# Patient Record
Sex: Male | Born: 1970 | Race: Black or African American | Hispanic: No | Marital: Single | Smoking: Former smoker
Health system: Southern US, Community
[De-identification: ages and names within clinical notes are randomized; demographics above are authoritative.]

## PROBLEM LIST (undated history)

## (undated) DIAGNOSIS — F329 Major depressive disorder, single episode, unspecified: Secondary | ICD-10-CM

## (undated) DIAGNOSIS — F32A Depression, unspecified: Secondary | ICD-10-CM

## (undated) DIAGNOSIS — Z89619 Acquired absence of unspecified leg above knee: Secondary | ICD-10-CM

## (undated) DIAGNOSIS — K219 Gastro-esophageal reflux disease without esophagitis: Secondary | ICD-10-CM

## (undated) DIAGNOSIS — M1990 Unspecified osteoarthritis, unspecified site: Secondary | ICD-10-CM

## (undated) HISTORY — DX: Gastro-esophageal reflux disease without esophagitis: K21.9

## (undated) HISTORY — PX: SKIN GRAFT: SHX250

## (undated) HISTORY — DX: Unspecified osteoarthritis, unspecified site: M19.90

## (undated) HISTORY — PX: ABDOMINAL SURGERY: SHX537

## (undated) HISTORY — PX: FOOT FUSION: SHX956

## (undated) HISTORY — PX: ABOVE KNEE LEG AMPUTATION: SUR20

## (undated) HISTORY — DX: Major depressive disorder, single episode, unspecified: F32.9

## (undated) HISTORY — DX: Depression, unspecified: F32.A

## (undated) HISTORY — DX: Acquired absence of unspecified leg above knee: Z89.619

## (undated) HISTORY — PX: CHOLECYSTECTOMY: SHX55

---

## 1999-12-14 DIAGNOSIS — Z89619 Acquired absence of unspecified leg above knee: Secondary | ICD-10-CM

## 1999-12-14 HISTORY — DX: Acquired absence of unspecified leg above knee: Z89.619

## 2013-04-07 ENCOUNTER — Emergency Department (HOSPITAL_COMMUNITY): Payer: Medicaid Other

## 2013-04-07 ENCOUNTER — Encounter (HOSPITAL_COMMUNITY): Payer: Self-pay | Admitting: Emergency Medicine

## 2013-04-07 ENCOUNTER — Telehealth (HOSPITAL_COMMUNITY): Payer: Self-pay | Admitting: Emergency Medicine

## 2013-04-07 ENCOUNTER — Emergency Department (HOSPITAL_COMMUNITY)
Admission: EM | Admit: 2013-04-07 | Discharge: 2013-04-07 | Disposition: A | Discharge status: Home / Self Care | Payer: Medicaid Other | Attending: Emergency Medicine | Admitting: Emergency Medicine

## 2013-04-07 DIAGNOSIS — W050XXA Fall from non-moving wheelchair, initial encounter: Secondary | ICD-10-CM | POA: Insufficient documentation

## 2013-04-07 DIAGNOSIS — Y9389 Activity, other specified: Secondary | ICD-10-CM | POA: Insufficient documentation

## 2013-04-07 DIAGNOSIS — S78119A Complete traumatic amputation at level between unspecified hip and knee, initial encounter: Secondary | ICD-10-CM | POA: Insufficient documentation

## 2013-04-07 DIAGNOSIS — S322XXA Fracture of coccyx, initial encounter for closed fracture: Secondary | ICD-10-CM

## 2013-04-07 DIAGNOSIS — S3210XA Unspecified fracture of sacrum, initial encounter for closed fracture: Secondary | ICD-10-CM | POA: Insufficient documentation

## 2013-04-07 DIAGNOSIS — Y92009 Unspecified place in unspecified non-institutional (private) residence as the place of occurrence of the external cause: Secondary | ICD-10-CM | POA: Insufficient documentation

## 2013-04-07 MED ORDER — MELOXICAM 15 MG PO TABS: 15.0000 mg | ORAL_TABLET | Freq: Every day | ORAL | Status: DC

## 2013-04-07 MED ORDER — HYDROCODONE-ACETAMINOPHEN 5-325 MG PO TABS: 1.0000 | ORAL_TABLET | Freq: Four times a day (QID) | ORAL | Status: DC | PRN

## 2013-04-07 NOTE — ED Notes (Signed)
NAD noted at time of d/c home 

## 2013-04-07 NOTE — ED Notes (Signed)
Pt with c/o tailbone/back pain that started "several weeks ago" after a fall. Pt was sliding into his wheelchair when chair moved and pt fell to floor. Denies any open areas or pressure sores.

## 2013-04-07 NOTE — ED Provider Notes (Signed)
Medical screening examination/treatment/procedure(s) were performed by non-physician practitioner and as supervising physician I was immediately available for consultation/collaboration.   Xoey Warmoth B. Bernette Mayers, MD 04/07/13 [phone]

## 2013-04-07 NOTE — ED Provider Notes (Signed)
History     CSN: [id number]  Arrival date & time 04/07/13  0711   First MD Initiated Contact with Patient 04/07/13 [phone]      Chief Complaint  Patient presents with  . Tailbone Pain    (Consider location/radiation/quality/duration/timing/severity/associated sxs/prior treatment) HPI [name] is a 42 year old male who presents emergency Department with chief complaint of tailbone pain. The patient has a history of traumatic leg amputation.  He uses a wheelchair. The patient states that one month ago as he was transferring from his bed into the wheelchair he slipped and fell directly on his bottom. Patient states that he has had chronic aching pain since that time.  He has been using ibuprofen without relief.  Patient states that over the past few days it has been waking him up when he rolls over onto his bottom.  She denies any sores or rashes on his bottom.  He denies any radiation into his hips or legs.  Denies any weakness.  No numbness or paresthesias. Denies fevers, chills, myalgias, arthralgias. Denies DOE, SOB, chest tightness or pressure, radiation to left arm, jaw or back, or diaphoresis. Denies dysuria, flank pain, suprapubic pain, frequency, urgency, or hematuria. Denies headaches, light headedness, weakness, visual disturbances. Denies abdominal pain, nausea, vomiting, diarrhea or constipation.    History reviewed. No pertinent past medical history.  Past Surgical History  Procedure Laterality Date  . Skin graft    . Abdominal surgery    . Above knee leg amputation Left   . Foot fusion Right     No family history on file.  History  Substance Use Topics  . Smoking status: Never Smoker   . Smokeless tobacco: Not on file  . Alcohol Use: No      Review of Systems Ten systems reviewed and are negative for acute change, except as noted in the HPI.   Allergies  Morphine and related  Home Medications   Current Outpatient Rx  Name  Route  Sig  Dispense  Refill   . acetaminophen (TYLENOL) 325 MG tablet   Oral   Take 650 mg by mouth every 6 (six) hours as needed for pain.         Marland Kitchen ibuprofen (ADVIL,MOTRIN) 800 MG tablet   Oral   Take 800 mg by mouth every 8 (eight) hours as needed for pain.           BP 122/80  Pulse 82  Temp(Src) 97.8 F (36.6 C) (Oral)  SpO2 98%  Physical Exam  Nursing note and vitals reviewed. Constitutional: He is oriented to person, place, and time. He appears well-developed and well-nourished. No distress.  HENT:  Head: Normocephalic and atraumatic.  Eyes: Conjunctivae are normal. No scleral icterus.  Neck: Normal range of motion. Neck supple.  Cardiovascular: Normal rate, regular rhythm and normal heart sounds.   Pulmonary/Chest: Effort normal and breath sounds normal. No respiratory distress.  Abdominal: Soft. There is no tenderness.  Musculoskeletal: He exhibits no edema.  Patient with exquisite tenderness to palpation of the coccyx.  No sores or rashes noted.  No open wounds.  Neurological: He is alert and oriented to person, place, and time. He has normal reflexes.  Skin: Skin is warm and dry. He is not diaphoretic.  Psychiatric: His behavior is normal.    ED Course  Procedures (including critical care time)  Labs Reviewed - No data to display Dg Sacrum/coccyx  04/07/2013  *RADIOLOGY REPORT*  Clinical Data: Post fall with persistent tail bone pain  SACRUM AND COCCYX - 2+ VIEW  Comparison: None.  Findings:  There is an ill-defined vertically aligned lucency through the mid aspect of the coccyx, nonspecific though may represent a nondisplaced fracture.  Limited visualization of the bilateral SI joints and pubic symphysis is normal.  Regional soft tissues are normal.  No radiopaque foreign body.  IMPRESSION:  Ill-defined vertically aligned lucency through the mid aspect of the coccyx, possibly a nutrient foramen, though a nondisplaced fracture may have a similar appearance.  Correlation for point tenderness  at this location is recommended.   Original Report Authenticated By: Tacey Ruiz, MD      1. Fracture of coccyx, closed, initial encounter       MDM  9:15 AM Patient with history of trauma to his tailbone.  X-ray is suspicious for a fracture of the coccyx.  He did correlation of point tenderness at the coccyx I suspect a healing fracture.  Patient will be discharged with NSAIDs and narcotic pain medications.  I given him followup with orthopedics.  The patient appears reasonably screened and/or stabilized for discharge and I doubt any other medical condition or other [facility] requiring further screening, evaluation, or treatment in the ED at this time prior to discharge.         Arthor Captain, PA-C 04/07/13 [phone]

## 2013-06-09 ENCOUNTER — Emergency Department (HOSPITAL_COMMUNITY)
Admission: EM | Admit: 2013-06-09 | Discharge: 2013-06-09 | Disposition: A | Discharge status: Home / Self Care | Payer: Medicaid Other | Attending: Emergency Medicine | Admitting: Emergency Medicine

## 2013-06-09 ENCOUNTER — Encounter (HOSPITAL_COMMUNITY): Payer: Self-pay | Admitting: Emergency Medicine

## 2013-06-09 DIAGNOSIS — M533 Sacrococcygeal disorders, not elsewhere classified: Secondary | ICD-10-CM | POA: Insufficient documentation

## 2013-06-09 DIAGNOSIS — S78119A Complete traumatic amputation at level between unspecified hip and knee, initial encounter: Secondary | ICD-10-CM | POA: Insufficient documentation

## 2013-06-09 DIAGNOSIS — N400 Enlarged prostate without lower urinary tract symptoms: Secondary | ICD-10-CM | POA: Insufficient documentation

## 2013-06-09 DIAGNOSIS — S3992XS Unspecified injury of lower back, sequela: Secondary | ICD-10-CM

## 2013-06-09 DIAGNOSIS — G8918 Other acute postprocedural pain: Secondary | ICD-10-CM | POA: Insufficient documentation

## 2013-06-09 MED ORDER — HYDROCODONE-ACETAMINOPHEN 5-325 MG PO TABS: ORAL_TABLET | ORAL | Status: DC

## 2013-06-09 NOTE — ED Provider Notes (Signed)
History    CSN: [id number] Arrival date & time 06/09/13  1149  First MD Initiated Contact with Patient 06/09/13 1240     Chief Complaint  Patient presents with  . Tailbone Pain   (Consider location/radiation/quality/duration/timing/severity/associated sxs/prior Treatment) HPI Comments: Patient presents with complaint of tailbone pain for the past 5 months. Patient had onset of pain after a fall onto his tailbone. He was seen in emergency department 2 months ago and was diagnosed with coccygeal fracture. Patient states that he's been treating himself with ibuprofen which has not been helping. Pain is waxing and waning but has become more severe in the past 5 days and is worse than it has been. No new injury the patient does a lot of sitting because of left below-the-knee amputation. Patient does have some pain with defecation. He has not noted any blood in the stool. He does state that he has had some recent trouble with erectile dysfunction and is wondering if this is part of his problem. Onset of symptoms gradual. Course is constant.   The history is provided by the patient.   History reviewed. No pertinent past medical history. Past Surgical History  Procedure Laterality Date  . Skin graft    . Abdominal surgery    . Above knee leg amputation Left   . Foot fusion Right    No family history on file. History  Substance Use Topics  . Smoking status: Never Smoker   . Smokeless tobacco: Not on file  . Alcohol Use: No    Review of Systems  Constitutional: Negative for fever.  HENT: Negative for sore throat and rhinorrhea.   Eyes: Negative for redness.  Respiratory: Negative for cough.   Cardiovascular: Negative for chest pain.  Gastrointestinal: Negative for nausea, vomiting, abdominal pain, diarrhea, blood in stool and rectal pain.  Genitourinary: Negative for dysuria.  Musculoskeletal: Positive for arthralgias. Negative for myalgias.  Skin: Negative for rash.  Neurological:  Negative for headaches.    Allergies  Morphine and related  Home Medications   Current Outpatient Rx  Name  Route  Sig  Dispense  Refill  . Ibuprofen (IBU PO)   Oral   Take 1-2 tablets by mouth every 4 (four) hours as needed (pain).          BP 130/74  Pulse 68  Temp(Src) 98.4 F (36.9 C)  Resp 16  SpO2 99% Physical Exam  Nursing note and vitals reviewed. Constitutional: He appears well-developed and well-nourished.  HENT:  Head: Normocephalic and atraumatic.  Eyes: Conjunctivae are normal.  Neck: Normal range of motion. Neck supple.  Pulmonary/Chest: No respiratory distress.  Genitourinary: Rectal exam shows tenderness (Tenderness with direct pressure over lower sacrum). Rectal exam shows no external hemorrhoid, no internal hemorrhoid, no fissure, no mass and anal tone normal. Guaiac negative stool. Prostate is enlarged. Prostate is not tender.  Musculoskeletal:  Tenderness over lower sacrum and coccyx externally.  Neurological: He is alert.  Skin: Skin is warm and dry.  Psychiatric: He has a normal mood and affect.    ED Course  Procedures (including critical care time) Labs Reviewed  OCCULT BLOOD X 1 CARD TO LAB, STOOL   No results found. 1. Tailbone injury, sequela     1:16 PM Patient seen and examined.   Vital signs reviewed and are as follows: Filed Vitals:   06/09/13 1152  BP: 130/74  Pulse: 68  Temp: 98.4 F (36.9 C)  Resp: 16   Rectal exam performed with nurse  chaperone.   Hemoccult negative. Patient informed.  Will discharge home with pain medication. Patient to continue ibuprofen as directed.  PCP referrals given. Patient urged to followup for evaluation of erectile dysfunction.   Orthopedic referral given in case pain persists.  Patient counseled on use of narcotic pain medications. Counseled not to combine these medications with others containing tylenol. Urged not to drink alcohol, drive, or perform any other activities that requires  focus while taking these medications. The patient verbalizes understanding and agrees with the plan.   MDM  Tailbone pain: Persistent. Pain is localized to sacrum and coccyx in external and internal exams. No masses felt. Orthopedic followup given with pain medication.  Erectile dysfunction: Referred to primary care physician for  evaluation.  Renne Crigler, PA-C 06/09/13 1520

## 2013-06-09 NOTE — ED Provider Notes (Signed)
Medical screening examination/treatment/procedure(s) were performed by non-physician practitioner and as supervising physician I was immediately available for consultation/collaboration. Devoria Albe, MD, Armando Gang   Ward Givens, MD 06/09/13 [phone]

## 2013-06-09 NOTE — ED Notes (Signed)
Pt. Stated, I've had tailbone pain for 5 months.

## 2013-07-31 ENCOUNTER — Encounter: Payer: Self-pay | Admitting: Internal Medicine

## 2013-07-31 ENCOUNTER — Ambulatory Visit: Payer: Medicaid Other | Attending: Internal Medicine | Admitting: Internal Medicine

## 2013-07-31 VITALS — BP 117/82 | HR 77 | Temp 97.8°F | Resp 16

## 2013-07-31 DIAGNOSIS — F32A Depression, unspecified: Secondary | ICD-10-CM | POA: Insufficient documentation

## 2013-07-31 DIAGNOSIS — N529 Male erectile dysfunction, unspecified: Secondary | ICD-10-CM

## 2013-07-31 DIAGNOSIS — G589 Mononeuropathy, unspecified: Secondary | ICD-10-CM

## 2013-07-31 DIAGNOSIS — G629 Polyneuropathy, unspecified: Secondary | ICD-10-CM | POA: Insufficient documentation

## 2013-07-31 DIAGNOSIS — K219 Gastro-esophageal reflux disease without esophagitis: Secondary | ICD-10-CM

## 2013-07-31 DIAGNOSIS — F3289 Other specified depressive episodes: Secondary | ICD-10-CM | POA: Insufficient documentation

## 2013-07-31 DIAGNOSIS — F329 Major depressive disorder, single episode, unspecified: Secondary | ICD-10-CM | POA: Insufficient documentation

## 2013-07-31 MED ORDER — SERTRALINE HCL 50 MG PO TABS: 50.0000 mg | ORAL_TABLET | Freq: Every day | ORAL | Status: DC

## 2013-07-31 MED ORDER — OMEPRAZOLE 40 MG PO CPDR: 40.0000 mg | DELAYED_RELEASE_CAPSULE | Freq: Every day | ORAL | Status: DC

## 2013-07-31 MED ORDER — GABAPENTIN 300 MG PO CAPS: 300.0000 mg | ORAL_CAPSULE | Freq: Three times a day (TID) | ORAL | Status: DC

## 2013-07-31 MED ORDER — TRAMADOL HCL 50 MG PO TABS: 50.0000 mg | ORAL_TABLET | Freq: Four times a day (QID) | ORAL | Status: DC | PRN
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
## 2013-07-31 NOTE — Progress Notes (Signed)
A1C 5.4%

## 2013-08-01 ENCOUNTER — Encounter: Payer: Self-pay | Admitting: Internal Medicine

## 2013-08-01 LAB — TSH: TSH: 0.759 u[IU]/mL (ref 0.350–4.500)

## 2013-08-01 LAB — HEMOGLOBIN A1C: Mean Plasma Glucose: 123 mg/dL — ABNORMAL HIGH (ref <117)

## 2013-08-01 LAB — CBC WITH DIFFERENTIAL/PLATELET
Basophils Absolute: 0 10*3/uL (ref 0.0–0.1)
Basophils Relative: 0 % (ref 0–1)
Eosinophils Relative: 1 % (ref 0–5)
Lymphocytes Relative: 28 % (ref 12–46)
MCHC: 33.9 g/dL (ref 30.0–36.0)
MCV: 81.5 fL (ref 78.0–100.0)
Monocytes Absolute: 0.5 10*3/uL (ref 0.1–1.0)
Neutro Abs: 5.4 10*3/uL (ref 1.7–7.7)
Platelets: 233 10*3/uL (ref 150–400)
RDW: 13.7 % (ref 11.5–15.5)
WBC: 8.3 10*3/uL (ref 4.0–10.5)

## 2013-08-01 LAB — COMPLETE METABOLIC PANEL WITH GFR
Albumin: 4.4 g/dL (ref 3.5–5.2)
BUN: 9 mg/dL (ref 6–23)
Calcium: 10.4 mg/dL (ref 8.4–10.5)
Chloride: 101 mEq/L (ref 96–112)
GFR, Est Non African American: >89 mL/min
Glucose, Bld: 77 mg/dL (ref 70–99)
Potassium: 4 mEq/L (ref 3.5–5.3)
Sodium: 138 mEq/L (ref 135–145)
Total Protein: 7.8 g/dL (ref 6.0–8.3)

## 2013-08-01 LAB — LIPID PANEL
HDL: 45 mg/dL (ref >39)
LDL Cholesterol: 92 mg/dL (ref 0–99)
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
## 2013-08-04 ENCOUNTER — Emergency Department (HOSPITAL_COMMUNITY): Payer: Medicaid Other

## 2013-08-04 ENCOUNTER — Telehealth (HOSPITAL_COMMUNITY): Payer: Self-pay | Admitting: *Deleted

## 2013-08-04 ENCOUNTER — Emergency Department (HOSPITAL_COMMUNITY)
Admission: EM | Admit: 2013-08-04 | Discharge: 2013-08-04 | Disposition: A | Discharge status: Home / Self Care | Payer: Medicaid Other | Attending: Emergency Medicine | Admitting: Emergency Medicine

## 2013-08-04 DIAGNOSIS — J3489 Other specified disorders of nose and nasal sinuses: Secondary | ICD-10-CM | POA: Insufficient documentation

## 2013-08-04 DIAGNOSIS — F3289 Other specified depressive episodes: Secondary | ICD-10-CM | POA: Insufficient documentation

## 2013-08-04 DIAGNOSIS — J029 Acute pharyngitis, unspecified: Secondary | ICD-10-CM | POA: Insufficient documentation

## 2013-08-04 DIAGNOSIS — F329 Major depressive disorder, single episode, unspecified: Secondary | ICD-10-CM | POA: Insufficient documentation

## 2013-08-04 DIAGNOSIS — Z79899 Other long term (current) drug therapy: Secondary | ICD-10-CM | POA: Insufficient documentation

## 2013-08-04 DIAGNOSIS — F17200 Nicotine dependence, unspecified, uncomplicated: Secondary | ICD-10-CM | POA: Insufficient documentation

## 2013-08-04 DIAGNOSIS — J069 Acute upper respiratory infection, unspecified: Secondary | ICD-10-CM

## 2013-08-04 DIAGNOSIS — G629 Polyneuropathy, unspecified: Secondary | ICD-10-CM

## 2013-08-04 DIAGNOSIS — S78119A Complete traumatic amputation at level between unspecified hip and knee, initial encounter: Secondary | ICD-10-CM | POA: Insufficient documentation

## 2013-08-04 MED ORDER — TRAMADOL HCL 50 MG PO TABS: 50.0000 mg | ORAL_TABLET | Freq: Four times a day (QID) | ORAL | Status: DC | PRN

## 2013-08-04 MED ORDER — HYDROCODONE-HOMATROPINE 5-1.5 MG/5ML PO SYRP: 5.0000 mL | ORAL_SOLUTION | Freq: Four times a day (QID) | ORAL | Status: DC | PRN

## 2013-08-04 MED ORDER — KETOROLAC TROMETHAMINE 60 MG/2ML IM SOLN
60.0000 mg | Freq: Once | INTRAMUSCULAR | Status: AC
  Administered 2013-08-04: 60 mg via INTRAMUSCULAR
  Filled 2013-08-04: qty 2

## 2013-08-04 MED ORDER — OXYMETAZOLINE HCL 0.05 % NA SOLN: 2.0000 | Freq: Two times a day (BID) | NASAL | Status: DC

## 2013-08-04 NOTE — ED Notes (Signed)
Pt c/o productive cough with yellow green sputum. Pt also c/o fever and sore throat for the past 2 days. Additionally, pt states "I broke my tail bone." Pt is complaining of coccyx pain and lower back pain. Pt is wheelchair bound.

## 2013-08-04 NOTE — ED Provider Notes (Signed)
CSN: [id number]     Arrival date & time 08/04/13  2121 History  This chart was scribed for non-physician practitioner working with Junius Argyle, MD by Ronal Fear, ED scribe. This patient was seen in room WTR8/WTR8 and the patient's care was started at 10:10 PM.    Chief Complaint  Patient presents with  . Cough    The history is provided by the patient. No language interpreter was used.   HPI Comments: Isaac Thomas is a 42 y.o. male who presents to the Emergency Department complaining of gradual onset, intermittent prodcutive cough of yellow sputum with associated congestion and sore throat that started 2 days ago. Complains of fever he had yesterday but did not check his temperature.He has tried nasal spray and sinus medication with no relief. No aggravating factors. Pt has chronic lower back pain due to a broken tail bone and is requesting pain medication. No new injury or changes in chronic back pain symptoms. Pt denies chills, rhinorrhea, ear pain and CP as associated symptoms.    Past Medical History  Diagnosis Date  . 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 Depression    Past Surgical History  Procedure Laterality Date  . Skin graft    . Abdominal surgery    . Above knee leg amputation Left   . Foot fusion Right    Family History  Problem Relation Age of Onset  . Hypertension Mother    History  Substance Use Topics  . Smoking status: Current Some Day Smoker    Types: Cigarettes  . Smokeless tobacco: Not on file     Comment: 1- 2 per day  . Alcohol Use: No    Review of Systems  HENT: Positive for congestion and sore throat. Negative for ear pain and rhinorrhea.   Respiratory: Positive for cough.   Cardiovascular: Negative for chest pain.  All other systems reviewed and are negative.    Allergies  Morphine and related  Home Medications   Current Outpatient Rx  Name  Route  Sig  Dispense  Refill  . gabapentin (NEURONTIN) 300 MG capsule   Oral  Take 1 capsule (300 mg total) by mouth 3 (three) times daily.   90 capsule   3   . HYDROcodone-acetaminophen (NORCO/VICODIN) 5-325 MG per tablet      Take 1-2 tablets every 6 hours as needed for severe pain   12 tablet   0   . HYDROcodone-homatropine (HYCODAN) 5-1.5 MG/5ML syrup   Oral   Take 5 mLs by mouth every 6 (six) hours as needed for cough.   120 mL   0   . Ibuprofen (IBU PO)   Oral   Take 1-2 tablets by mouth every 4 (four) hours as needed (pain).         Marland Kitchen omeprazole (PRILOSEC) 40 MG capsule   Oral   Take 1 capsule (40 mg total) by mouth daily.   30 capsule   3   . oxymetazoline (AFRIN NASAL SPRAY) 0.05 % nasal spray   Nasal   Place 2 sprays into the nose 2 (two) times daily.   30 mL   0   . sertraline (ZOLOFT) 50 MG tablet   Oral   Take 1 tablet (50 mg total) by mouth daily.   30 tablet   3   . traMADol (ULTRAM) 50 MG tablet   Oral   Take 1 tablet (50 mg total) by mouth every 6 (six) hours as  needed for pain.   10 tablet   0    BP 107/85  Pulse 85  Temp(Src) 98.1 F (36.7 C) (Oral)  Resp 17  SpO2 98% Physical Exam  Nursing note and vitals reviewed. Constitutional: He is oriented to person, place, and time. He appears well-developed and well-nourished. No distress.  HENT:  Head: Normocephalic and atraumatic.  Right Ear: External ear normal.  Left Ear: External ear normal.  Nose: Rhinorrhea present. Right sinus exhibits no frontal sinus tenderness. Left sinus exhibits no frontal sinus tenderness.  Mouth/Throat: Uvula is midline, oropharynx is clear and moist and mucous membranes are normal. No oropharyngeal exudate.  Eyes: Conjunctivae and EOM are normal.  Neck: Neck supple. No tracheal deviation present.  Cardiovascular: Normal rate, regular rhythm and normal heart sounds.   Pulmonary/Chest: Effort normal and breath sounds normal. No respiratory distress. He exhibits no tenderness.  Abdominal: Soft. There is no tenderness.  Musculoskeletal:  Normal range of motion.  Lymphadenopathy:    He has no cervical adenopathy.  Neurological: He is alert and oriented to person, place, and time.  Skin: Skin is warm and dry.  Psychiatric: He has a normal mood and affect. His behavior is normal.    ED Course  DIAGNOSTIC STUDIES: Oxygen Saturation is 98% on room air, normal by my interpretation.    COORDINATION OF CARE: 10:11 PM-Discussed treatment plan which includes tylenol with codeine with pt at bedside and pt agreed to plan.    Procedures (including critical care time)  Labs Reviewed - No data to display Dg Chest 2 View  08/04/2013   *RADIOLOGY REPORT*  Clinical Data: Cough for 3 days.  Smoker.  Shortness of breath.  CHEST - 2 VIEW  Comparison: None.  Findings: The heart size and pulmonary vascularity are normal. The lungs appear clear and expanded without focal air space disease or consolidation. No blunting of the costophrenic angles.  No pneumothorax.  Mediastinal contours appear intact.  Mild degenerative changes in the spine.  IMPRESSION: No evidence of active pulmonary disease.   Original Report Authenticated By: Burman Nieves, M.D.   1. URI (upper respiratory infection)   2. Neuropathy     MDM  Afebrile, NAD, non-toxic appearing, AAOx4. Pt CXR negative for acute infiltrate. Patients symptoms are consistent with URI, likely viral etiology. Discussed that antibiotics are not indicated for viral infections. Pt will be discharged with symptomatic treatment.  Verbalizes understanding and is agreeable with plan. Pt is hemodynamically stable & in NAD prior to dc. Patient with back pain.  No neurological deficits and normal neuro exam.  No loss of bowel or bladder control.  No concern for cauda equina.  No fever, night sweats, weight loss, h/o cancer, IVDU.  RICE protocol and pain medicine indicated and discussed with patient.  I have reviewed records of multiple ED visits with similar or other pain related complaints, usually with  negative workups.  I feel that the pt's pain is chronic. Discussed chronic pain management and that the ED is not designed to treat chronic pain. Provided resource list that includes the number for Pain Management as well as information on ED chronic pain management policy.Treated pain in ED and given limited prescription of pain medication for at home. Discussed reasons to seek immediate care. Patient expresses understanding and agrees with plan.           I personally performed the services described in this documentation, which was scribed in my presence. The recorded information has been reviewed and is  accurate.     Jeannetta Ellis, PA-C 08/05/13 [phone]

## 2013-08-05 NOTE — ED Provider Notes (Signed)
Medical screening examination/treatment/procedure(s) were performed by non-physician practitioner and as supervising physician I was immediately available for consultation/collaboration.   Junius Argyle, MD 08/05/13 1144

## 2013-08-06 ENCOUNTER — Telehealth: Payer: Self-pay | Admitting: Emergency Medicine

## 2013-08-06 NOTE — Telephone Encounter (Signed)
Pt need referral for new w/c. States w/c is broke

## 2013-08-08 ENCOUNTER — Telehealth: Payer: Self-pay

## 2013-08-08 NOTE — Telephone Encounter (Signed)
Patient is aware of his lab results Will watch his diet

## 2013-08-08 NOTE — Telephone Encounter (Signed)
Message copied by Lestine Mount on Wed Aug 08, 2013  1:57 PM ------      Message from: Jeanann Lewandowsky E      Created: Wed Aug 08, 2013 12:00 PM       Please call patient to inform him her most of her lab results come back normal, however his hemoglobin A1c indicates that he is at risk of developing diabetes, and his triglyceride is slightly elevated. At this point he needs to adhere with diet and exercise that would reduce his risk of developing diabetes. This will include a carbohydrate diet, low-fat diet, high protein diet, lots of vegetables. And exercise at least 3 times a week,30 minutes each time. We will need to repeat all this lab again in about 3 months. ------

## 2013-08-12 ENCOUNTER — Encounter (HOSPITAL_COMMUNITY): Payer: Self-pay | Admitting: *Deleted

## 2013-08-12 ENCOUNTER — Inpatient Hospital Stay (HOSPITAL_COMMUNITY)
Admission: EM | Admit: 2013-08-12 | Discharge: 2013-08-16 | DRG: 603 | Disposition: A | Discharge status: Home / Self Care | Payer: Medicaid Other | Attending: Internal Medicine | Admitting: Internal Medicine

## 2013-08-12 DIAGNOSIS — Z993 Dependence on wheelchair: Secondary | ICD-10-CM

## 2013-08-12 DIAGNOSIS — D72829 Elevated white blood cell count, unspecified: Secondary | ICD-10-CM | POA: Diagnosis present

## 2013-08-12 DIAGNOSIS — Z79899 Other long term (current) drug therapy: Secondary | ICD-10-CM

## 2013-08-12 DIAGNOSIS — L0390 Cellulitis, unspecified: Secondary | ICD-10-CM

## 2013-08-12 DIAGNOSIS — L0291 Cutaneous abscess, unspecified: Secondary | ICD-10-CM

## 2013-08-12 DIAGNOSIS — Z89619 Acquired absence of unspecified leg above knee: Secondary | ICD-10-CM

## 2013-08-12 DIAGNOSIS — F329 Major depressive disorder, single episode, unspecified: Secondary | ICD-10-CM

## 2013-08-12 DIAGNOSIS — S78119A Complete traumatic amputation at level between unspecified hip and knee, initial encounter: Secondary | ICD-10-CM

## 2013-08-12 DIAGNOSIS — L02419 Cutaneous abscess of limb, unspecified: Principal | ICD-10-CM | POA: Diagnosis present

## 2013-08-12 DIAGNOSIS — Z945 Skin transplant status: Secondary | ICD-10-CM

## 2013-08-12 DIAGNOSIS — K219 Gastro-esophageal reflux disease without esophagitis: Secondary | ICD-10-CM | POA: Diagnosis present

## 2013-08-12 DIAGNOSIS — M533 Sacrococcygeal disorders, not elsewhere classified: Secondary | ICD-10-CM | POA: Diagnosis present

## 2013-08-12 DIAGNOSIS — G629 Polyneuropathy, unspecified: Secondary | ICD-10-CM | POA: Diagnosis present

## 2013-08-12 DIAGNOSIS — F17200 Nicotine dependence, unspecified, uncomplicated: Secondary | ICD-10-CM | POA: Diagnosis present

## 2013-08-12 DIAGNOSIS — G8929 Other chronic pain: Secondary | ICD-10-CM | POA: Diagnosis present

## 2013-08-12 DIAGNOSIS — G589 Mononeuropathy, unspecified: Secondary | ICD-10-CM | POA: Diagnosis present

## 2013-08-12 DIAGNOSIS — E876 Hypokalemia: Secondary | ICD-10-CM | POA: Diagnosis not present

## 2013-08-12 DIAGNOSIS — N529 Male erectile dysfunction, unspecified: Secondary | ICD-10-CM

## 2013-08-12 LAB — CBC WITH DIFFERENTIAL/PLATELET
Basophils Absolute: 0 10*3/uL (ref 0.0–0.1)
Lymphocytes Relative: 6 % — ABNORMAL LOW (ref 12–46)
Lymphs Abs: 1.1 10*3/uL (ref 0.7–4.0)
MCV: 83.5 fL (ref 78.0–100.0)
Neutro Abs: 17.7 10*3/uL — ABNORMAL HIGH (ref 1.7–7.7)
Neutrophils Relative %: 91 % — ABNORMAL HIGH (ref 43–77)
Platelets: 227 10*3/uL (ref 150–400)
RBC: 5.81 MIL/uL (ref 4.22–5.81)
RDW: 13.2 % (ref 11.5–15.5)
WBC: 19.3 10*3/uL — ABNORMAL HIGH (ref 4.0–10.5)

## 2013-08-12 LAB — POCT I-STAT, CHEM 8
BUN: 7 mg/dL (ref 6–23)
Chloride: 103 mEq/L (ref 96–112)
Creatinine, Ser: 1.2 mg/dL (ref 0.50–1.35)
Potassium: 4 mEq/L (ref 3.5–5.1)
Sodium: 140 mEq/L (ref 135–145)
TCO2: 26 mmol/L (ref 0–100)

## 2013-08-12 MED ORDER — HYDROCODONE-ACETAMINOPHEN 5-325 MG PO TABS
1.0000 | ORAL_TABLET | Freq: Once | ORAL | Status: AC
  Administered 2013-08-12: 1 via ORAL
  Filled 2013-08-12: qty 1

## 2013-08-12 MED ORDER — VANCOMYCIN HCL IN DEXTROSE 1-5 GM/200ML-% IV SOLN
1000.0000 mg | Freq: Once | INTRAVENOUS | Status: AC
  Administered 2013-08-13: 1000 mg via INTRAVENOUS
  Filled 2013-08-12: qty 200

## 2013-08-12 NOTE — ED Provider Notes (Signed)
CSN: [id number]     Arrival date & time 08/12/13  2229 History   First MD Initiated Contact with Patient 08/12/13 2246     Chief Complaint  Patient presents with  . Leg Pain   (Consider location/radiation/quality/duration/timing/severity/associated sxs/prior Treatment) HPI Comments: Patient with Hx extensive surgery to R lower leg wearing a police ankle bracelet now with pain swelling and erythema to medial ankle.  Tonight develop chills and low grade fever  Patient is a 42 y.o. male presenting with leg pain. The history is provided by the patient.  Leg Pain Location:  Leg Injury: no   Leg location:  R leg Pain details:    Quality:  Aching, dull and pressure   Radiates to:  Does not radiate   Severity:  Moderate   Onset quality:  Gradual   Timing:  Constant   Progression:  Worsening Chronicity:  New Dislocation: no   Tetanus status:  Unknown Prior injury to area:  No Relieved by:  Nothing Worsened by:  Nothing tried Ineffective treatments:  None tried Associated symptoms: fever and swelling     Past Medical History  Diagnosis Date  . 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 Depression    Past Surgical History  Procedure Laterality Date  . Skin graft    . Abdominal surgery    . Above knee leg amputation Left   . Foot fusion Right    Family History  Problem Relation Age of Onset  . Hypertension Mother    History  Substance Use Topics  . Smoking status: Current Some Day Smoker    Types: Cigarettes  . Smokeless tobacco: Not on file     Comment: 1- 2 per day  . Alcohol Use: No    Review of Systems  Constitutional: Positive for fever and chills.  Respiratory: Negative for cough and shortness of breath.   Cardiovascular: Positive for leg swelling.  Musculoskeletal: Positive for arthralgias.  Skin: Positive for wound.  All other systems reviewed and are negative.    Allergies  Morphine and related  Home Medications   Current Outpatient Rx  Name  Route   Sig  Dispense  Refill  . gabapentin (NEURONTIN) 300 MG capsule   Oral   Take 1 capsule (300 mg total) by mouth 3 (three) times daily.   90 capsule   3   . HYDROcodone-homatropine (HYCODAN) 5-1.5 MG/5ML syrup   Oral   Take 5 mLs by mouth every 6 (six) hours as needed for cough.   120 mL   0   . Ibuprofen (IBU PO)   Oral   Take 1-2 tablets by mouth every 4 (four) hours as needed (pain).         Marland Kitchen omeprazole (PRILOSEC) 40 MG capsule   Oral   Take 1 capsule (40 mg total) by mouth daily.   30 capsule   3   . oxymetazoline (AFRIN NASAL SPRAY) 0.05 % nasal spray   Nasal   Place 2 sprays into the nose 2 (two) times daily.   30 mL   0   . sertraline (ZOLOFT) 50 MG tablet   Oral   Take 1 tablet (50 mg total) by mouth daily.   30 tablet   3   . traMADol (ULTRAM) 50 MG tablet   Oral   Take 1 tablet (50 mg total) by mouth every 6 (six) hours as needed for pain.   10 tablet   0    BP  134/87  Pulse 123  Temp(Src) 100.7 F (38.2 C) (Oral)  Resp 20  SpO2 98% Physical Exam  Nursing note and vitals reviewed. Constitutional: He is oriented to person, place, and time. He appears well-developed and well-nourished.  HENT:  Head: Normocephalic and atraumatic.  Eyes: Pupils are equal, round, and reactive to light.  Neck: Normal range of motion.  Cardiovascular: Normal rate.   Pulmonary/Chest: Effort normal.  Musculoskeletal: He exhibits tenderness. He exhibits no edema.       Legs:      Feet:  Extensive skin grafting knee to ankle  Pink area normal tissue Red area small abscess flatulent   Neurological: He is alert and oriented to person, place, and time.  Skin: Skin is warm. There is erythema.    ED Course  INCISION AND DRAINAGE Date/Time: 08/13/2013 12:40 AM Performed by: Arman Filter Authorized by: Arman Filter Consent: Verbal consent obtained. written consent not obtained. Risks and benefits: risks, benefits and alternatives were discussed Consent given by:  patient Patient understanding: patient states understanding of the procedure being performed Patient identity confirmed: verbally with patient Type: abscess Body area: lower extremity Location details: right ankle Anesthesia: local infiltration Local anesthetic: lidocaine 1% with epinephrine Anesthetic total: 2 ml Patient sedated: no Scalpel size: 11 Needle gauge: 22 Complexity: simple Drainage: purulent Drainage amount: scant Wound treatment: wound left open Patient tolerance: Patient tolerated the procedure well with no immediate complications.   (including critical care time) Labs Review Labs Reviewed  CBC WITH DIFFERENTIAL - Abnormal; Notable for the following:    WBC 19.3 (*)    Neutrophils Relative % 91 (*)    Neutro Abs 17.7 (*)    Lymphocytes Relative 6 (*)    All other components within normal limits  POCT I-STAT, CHEM 8 - Abnormal; Notable for the following:    Glucose, Bld 116 (*)    Hemoglobin 18.4 (*)    HCT 54.0 (*)    All other components within normal limits   Imaging Review No results found.  MDM   1. Abscess    Patient has apparent abscess medial R ankle involving some normal undamaged tissue as well as previously grafted area  I&D of abscess- small amount purulent  Plan to admit on IV antibiotic     Arman Filter, NP 08/13/13 0046  Arman Filter, NP 08/13/13 0107

## 2013-08-12 NOTE — ED Notes (Signed)
Pt seen here last week for not feeling well, now has fever and redness on leg, warm to touch

## 2013-08-13 ENCOUNTER — Emergency Department (HOSPITAL_COMMUNITY): Payer: Medicaid Other

## 2013-08-13 DIAGNOSIS — S78119A Complete traumatic amputation at level between unspecified hip and knee, initial encounter: Secondary | ICD-10-CM

## 2013-08-13 DIAGNOSIS — L0390 Cellulitis, unspecified: Secondary | ICD-10-CM

## 2013-08-13 DIAGNOSIS — Z9889 Other specified postprocedural states: Secondary | ICD-10-CM

## 2013-08-13 DIAGNOSIS — G589 Mononeuropathy, unspecified: Secondary | ICD-10-CM

## 2013-08-13 DIAGNOSIS — L0291 Cutaneous abscess, unspecified: Secondary | ICD-10-CM

## 2013-08-13 DIAGNOSIS — Z945 Skin transplant status: Secondary | ICD-10-CM

## 2013-08-13 DIAGNOSIS — Z89619 Acquired absence of unspecified leg above knee: Secondary | ICD-10-CM

## 2013-08-13 DIAGNOSIS — E876 Hypokalemia: Secondary | ICD-10-CM

## 2013-08-13 DIAGNOSIS — K219 Gastro-esophageal reflux disease without esophagitis: Secondary | ICD-10-CM

## 2013-08-13 LAB — COMPREHENSIVE METABOLIC PANEL
AST: 21 U/L (ref 0–37)
Albumin: 3.4 g/dL — ABNORMAL LOW (ref 3.5–5.2)
Alkaline Phosphatase: 54 U/L (ref 39–117)
BUN: 7 mg/dL (ref 6–23)
CO2: 26 mEq/L (ref 19–32)
Chloride: 101 mEq/L (ref 96–112)
Creatinine, Ser: 0.99 mg/dL (ref 0.50–1.35)
GFR calc non Af Amer: >90 mL/min (ref >90)
Potassium: 3.9 mEq/L (ref 3.5–5.1)
Total Bilirubin: 0.5 mg/dL (ref 0.3–1.2)

## 2013-08-13 LAB — URINALYSIS, ROUTINE W REFLEX MICROSCOPIC
Bilirubin Urine: NEGATIVE
Glucose, UA: NEGATIVE mg/dL
Ketones, ur: NEGATIVE mg/dL
Protein, ur: NEGATIVE mg/dL
pH: 6 (ref 5.0–8.0)

## 2013-08-13 LAB — MRSA PCR SCREENING: MRSA by PCR: NEGATIVE

## 2013-08-13 LAB — GLUCOSE, CAPILLARY: Glucose-Capillary: 118 mg/dL — ABNORMAL HIGH (ref 70–99)

## 2013-08-13 LAB — CK: Total CK: 307 U/L — ABNORMAL HIGH (ref 7–232)

## 2013-08-13 MED ORDER — FAMOTIDINE IN NACL 20-0.9 MG/50ML-% IV SOLN
20.0000 mg | Freq: Two times a day (BID) | INTRAVENOUS | Status: DC
  Administered 2013-08-13 (×2): 20 mg via INTRAVENOUS
  Filled 2013-08-13 (×2): qty 50

## 2013-08-13 MED ORDER — PANTOPRAZOLE SODIUM 40 MG PO TBEC
40.0000 mg | DELAYED_RELEASE_TABLET | Freq: Every day | ORAL | Status: DC
  Administered 2013-08-13 – 2013-08-16 (×4): 40 mg via ORAL
  Filled 2013-08-13 (×4): qty 1

## 2013-08-13 MED ORDER — DIPHENHYDRAMINE HCL 50 MG/ML IJ SOLN
INTRAMUSCULAR | Status: AC
  Administered 2013-08-13: 02:00:00
  Filled 2013-08-13: qty 1

## 2013-08-13 MED ORDER — ONDANSETRON HCL 4 MG/2ML IJ SOLN
4.0000 mg | Freq: Four times a day (QID) | INTRAMUSCULAR | Status: DC | PRN
  Filled 2013-08-13: qty 2

## 2013-08-13 MED ORDER — DIPHENHYDRAMINE HCL 50 MG/ML IJ SOLN: 12.5000 mg | Freq: Four times a day (QID) | INTRAMUSCULAR | Status: DC | PRN

## 2013-08-13 MED ORDER — GABAPENTIN 300 MG PO CAPS
300.0000 mg | ORAL_CAPSULE | Freq: Three times a day (TID) | ORAL | Status: DC
  Administered 2013-08-13 – 2013-08-16 (×10): 300 mg via ORAL
  Filled 2013-08-13 (×12): qty 1

## 2013-08-13 MED ORDER — SODIUM CHLORIDE 0.9 % IJ SOLN
3.0000 mL | Freq: Two times a day (BID) | INTRAMUSCULAR | Status: DC
  Administered 2013-08-15 – 2013-08-16 (×2): 3 mL via INTRAVENOUS

## 2013-08-13 MED ORDER — OXYCODONE HCL 5 MG PO TABS
5.0000 mg | ORAL_TABLET | ORAL | Status: DC | PRN
  Administered 2013-08-13: 5 mg via ORAL
  Filled 2013-08-13 (×2): qty 1

## 2013-08-13 MED ORDER — ENOXAPARIN SODIUM 60 MG/0.6ML ~~LOC~~ SOLN
50.0000 mg | SUBCUTANEOUS | Status: DC
  Administered 2013-08-13 – 2013-08-16 (×4): 50 mg via SUBCUTANEOUS
  Filled 2013-08-13 (×4): qty 0.6

## 2013-08-13 MED ORDER — VANCOMYCIN HCL IN DEXTROSE 1-5 GM/200ML-% IV SOLN
1000.0000 mg | Freq: Once | INTRAVENOUS | Status: AC
  Administered 2013-08-13: 1000 mg via INTRAVENOUS
  Filled 2013-08-13 (×2): qty 200

## 2013-08-13 MED ORDER — SODIUM CHLORIDE 0.9 % IV SOLN
INTRAVENOUS | Status: DC
  Administered 2013-08-13: 05:00:00 via INTRAVENOUS

## 2013-08-13 MED ORDER — BOOST PLUS PO LIQD
237.0000 mL | Freq: Every day | ORAL | Status: DC | PRN
  Administered 2013-08-14: 237 mL via ORAL
  Filled 2013-08-13: qty 237

## 2013-08-13 MED ORDER — ACETAMINOPHEN 325 MG PO TABS: 650.0000 mg | ORAL_TABLET | Freq: Four times a day (QID) | ORAL | Status: DC | PRN

## 2013-08-13 MED ORDER — VANCOMYCIN HCL IN DEXTROSE 1-5 GM/200ML-% IV SOLN
1000.0000 mg | Freq: Two times a day (BID) | INTRAVENOUS | Status: DC
  Administered 2013-08-13 – 2013-08-14 (×2): 1000 mg via INTRAVENOUS
  Filled 2013-08-13 (×4): qty 200

## 2013-08-13 MED ORDER — OXYCODONE-ACETAMINOPHEN 5-325 MG PO TABS
1.0000 | ORAL_TABLET | Freq: Four times a day (QID) | ORAL | Status: DC | PRN
Start: 2013-08-13 — End: 2013-08-15
  Administered 2013-08-13 (×3): 2 via ORAL
  Administered 2013-08-13: 1 via ORAL
  Administered 2013-08-14 – 2013-08-15 (×5): 2 via ORAL
  Filled 2013-08-13 (×3): qty 2
  Filled 2013-08-13: qty 1
  Filled 2013-08-13 (×5): qty 2

## 2013-08-13 MED ORDER — ADULT MULTIVITAMIN W/MINERALS CH
1.0000 | ORAL_TABLET | Freq: Every day | ORAL | Status: DC
  Administered 2013-08-13 – 2013-08-16 (×4): 1 via ORAL
  Filled 2013-08-13 (×4): qty 1

## 2013-08-13 MED ORDER — SERTRALINE HCL 50 MG PO TABS
50.0000 mg | ORAL_TABLET | Freq: Every day | ORAL | Status: DC
  Filled 2013-08-13 (×4): qty 1

## 2013-08-13 MED ORDER — PNEUMOCOCCAL VAC POLYVALENT 25 MCG/0.5ML IJ INJ
0.5000 mL | INJECTION | INTRAMUSCULAR | Status: AC
  Filled 2013-08-13 (×2): qty 0.5

## 2013-08-13 MED ORDER — ACETAMINOPHEN 650 MG RE SUPP: 650.0000 mg | Freq: Four times a day (QID) | RECTAL | Status: DC | PRN

## 2013-08-13 MED ORDER — DIPHENHYDRAMINE HCL 50 MG/ML IJ SOLN
25.0000 mg | Freq: Once | INTRAMUSCULAR | Status: AC
  Administered 2013-08-13: 25 mg via INTRAVENOUS

## 2013-08-13 MED ORDER — SENNOSIDES-DOCUSATE SODIUM 8.6-50 MG PO TABS
1.0000 | ORAL_TABLET | Freq: Every evening | ORAL | Status: DC | PRN
  Administered 2013-08-14 – 2013-08-15 (×2): 1 via ORAL
  Filled 2013-08-13 (×2): qty 1

## 2013-08-13 MED ORDER — ONDANSETRON HCL 4 MG PO TABS: 4.0000 mg | ORAL_TABLET | Freq: Four times a day (QID) | ORAL | Status: DC | PRN

## 2013-08-13 MED ORDER — OXYCODONE HCL 5 MG PO TABS
5.0000 mg | ORAL_TABLET | ORAL | Status: DC | PRN
  Administered 2013-08-13 – 2013-08-15 (×3): 10 mg via ORAL
  Filled 2013-08-13 (×3): qty 2
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
## 2013-08-13 NOTE — ED Notes (Signed)
Patient transported to X-ray 
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
## 2013-08-13 NOTE — Progress Notes (Signed)
Patient seen and examined. Admitted for RLE cellulitis. Continue vancomycin. Will have ankle monitoring device removed as that leg is quite swollen. Continue to follow.  Peggye Pitt, MD [facility] Pager: [phone]

## 2013-08-13 NOTE — ED Provider Notes (Signed)
12:10 AM patient evaluated, right ankle pain, redness, warmth, fever.  Has left AKA. His febrile in the ED and tachycardic and on exam, right lower extremity with [employer] ankle brace and underlying area of erythema, tenderness and fluctuance with distal neurovascular intact. Fluctuance and tenderness at the border of previous skin graft. No active drainage. No streaking erythema.  Diagnosis abscess and cellulitis  Plan IV antibiotics and admit  I discussed with [facility], they are not regulating this brace and recommend contacting [employer] to have it removed as needed.   Medical screening examination/treatment/procedure(s) were conducted as a shared visit with non-physician practitioner(s) and myself.  I personally evaluated the patient during the encounter  Sunnie Nielsen, MD 08/13/13 [phone]
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
## 2013-08-13 NOTE — Progress Notes (Signed)
PHARMACY - LOVENOX (brief note)  Pharmacy asked to adjust Lovenox dose as necessary.  42 yr male with CrCl ~ 91 ml/min, TBW = 102.2 kg and BMI = 34  As BMI > 30, will adjust Lovenox to 0.5 mg/kg/q24h for VTE prophylaxis  PLAN:  Change Lovenox to 50 mg sq q24h.  Terrilee Files, PharmD 08/13/13

## 2013-08-13 NOTE — ED Provider Notes (Signed)
Medical screening examination/treatment/procedure(s) were conducted as a shared visit with non-physician practitioner(s) and myself.  I personally evaluated the patient during the encounter  Sunnie Nielsen, MD 08/13/13 0157

## 2013-08-14 DIAGNOSIS — K219 Gastro-esophageal reflux disease without esophagitis: Secondary | ICD-10-CM

## 2013-08-14 DIAGNOSIS — L0291 Cutaneous abscess, unspecified: Secondary | ICD-10-CM

## 2013-08-14 LAB — CBC
Hemoglobin: 14.3 g/dL (ref 13.0–17.0)
MCHC: 32.5 g/dL (ref 30.0–36.0)
Platelets: 179 10*3/uL (ref 150–400)

## 2013-08-14 LAB — BASIC METABOLIC PANEL
BUN: 8 mg/dL (ref 6–23)
Calcium: 9.2 mg/dL (ref 8.4–10.5)
GFR calc Af Amer: 85 mL/min — ABNORMAL LOW (ref >90)
GFR calc non Af Amer: 74 mL/min — ABNORMAL LOW (ref >90)
Potassium: 3.2 mEq/L — ABNORMAL LOW (ref 3.5–5.1)
Sodium: 132 mEq/L — ABNORMAL LOW (ref 135–145)

## 2013-08-14 LAB — GLUCOSE, CAPILLARY: Glucose-Capillary: 97 mg/dL (ref 70–99)

## 2013-08-14 MED ORDER — VANCOMYCIN HCL IN DEXTROSE 1-5 GM/200ML-% IV SOLN
1000.0000 mg | Freq: Three times a day (TID) | INTRAVENOUS | Status: DC
  Administered 2013-08-14 – 2013-08-16 (×5): 1000 mg via INTRAVENOUS
  Filled 2013-08-14 (×8): qty 200
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
## 2013-08-14 NOTE — Progress Notes (Signed)
[facility] PROGRESS NOTE  Isaac Thomas UUV:[id number] DOB: [birth year] DOA: 08/12/2013 PCP: No PCP Per Patient  Assessment/Plan: Right Lower Extremity Cellulitis -Continue vancomycin. -Had a low-grade temp overnight. -Cx data remains negative to date.  Leukocytosis -Improving. -Follow with antibiotics.  Neuropathy -Continue neurontin.  Code Status: Full code Family Communication: Patient only  Disposition Plan: Back to halfway house when medically stable.   Consultants:  None   Antibiotics:  Vancomycin    Subjective: Febrile overnight to 100.9.  Objective: Filed Vitals:   08/13/13 1400 08/13/13 2200 08/14/13 0547 08/14/13 1340  BP: 90/60 109/66 118/72 114/80  Pulse: 95 102 92 91  Temp:  98.8 F (37.1 C) 100.9 F (38.3 C) 98.2 F (36.8 C)  TempSrc: Oral Oral Oral Oral  Resp:  18 20 16   Height:      Weight:      SpO2: 99% 93% 99% 98%    Intake/Output Summary (Last 24 hours) at 08/14/13 1710 Last data filed at 08/14/13 1645  Gross per 24 hour  Intake   2030 ml  Output   4350 ml  Net  -2320 ml   Filed Weights   08/13/13 0237  Weight: 102.2 kg (225 lb 5 oz)    Exam:   General:  AA Ox3  Cardiovascular: RRR  Respiratory: CTA B  Abdomen: S/NT/ND/+BS/no masses or organomegaly noted.  Extremities: s/p left AKA, Right: erythema and edema up to midshin. Has multiple scars from prior surgeries.   Neurologic:  Grossly intact and non-focal.  Data Reviewed: Basic Metabolic Panel:  Recent Labs Lab 08/12/13 2317 08/13/13 0345 08/14/13 0512  NA 140 136 132*  K 4.0 3.9 3.2*  CL 103 101 97  CO2  --  26 27  GLUCOSE 116* 103* 111*  BUN 7 7 8   CREATININE 1.20 0.99 1.18  CALCIUM  --  9.3 9.2   Liver Function Tests:  Recent Labs Lab 08/13/13 0345  AST 21  ALT 18  ALKPHOS 54  BILITOT 0.5  PROT 7.8  ALBUMIN 3.4*   No results found for this basename: LIPASE, AMYLASE,  in the last 168 hours No results found for this basename:  AMMONIA,  in the last 168 hours CBC:  Recent Labs Lab 08/12/13 2312 08/12/13 2317 08/14/13 0512  WBC 19.3*  --  18.4*  NEUTROABS 17.7*  --   --   HGB 16.0 18.4* 14.3  HCT 48.5 54.0* 44.0  MCV 83.5  --  84.0  PLT 227  --  179   Cardiac Enzymes:  Recent Labs Lab 08/13/13 0345  CKTOTAL 307*   BNP (last 3 results) No results found for this basename: PROBNP,  in the last 8760 hours CBG:  Recent Labs Lab 08/13/13 0729 08/14/13 0719  GLUCAP 118* 97    Recent Results (from the past 240 hour(s))  CULTURE, BLOOD (ROUTINE X 2)     Status: None   Collection Time    08/13/13  3:18 AM      Result Value Range Status   Specimen Description BLOOD RIGHT ANTECUBITAL   Final   Special Requests BOTTLES DRAWN AEROBIC ONLY 4CC   Final   Culture  Setup Time     Final   Value: 08/13/2013 11:38     Performed at [facility]   Culture     Final   Value:        BLOOD CULTURE RECEIVED NO GROWTH TO DATE CULTURE WILL BE HELD FOR 5 DAYS BEFORE ISSUING A FINAL NEGATIVE  REPORT     Performed at [facility]   Report Status PENDING   Incomplete  CULTURE, BLOOD (ROUTINE X 2)     Status: None   Collection Time    08/13/13  3:45 AM      Result Value Range Status   Specimen Description BLOOD RIGHT ANTECUBITAL   Final   Special Requests BOTTLES DRAWN AEROBIC AND ANAEROBIC   Final   Culture  Setup Time     Final   Value: 08/13/2013 11:37     Performed at [facility]   Culture     Final   Value:        BLOOD CULTURE RECEIVED NO GROWTH TO DATE CULTURE WILL BE HELD FOR 5 DAYS BEFORE ISSUING A FINAL NEGATIVE REPORT     Performed at [facility]   Report Status PENDING   Incomplete  MRSA PCR SCREENING     Status: None   Collection Time    08/13/13  6:55 AM      Result Value Range Status   MRSA by PCR NEGATIVE  NEGATIVE Final   Comment:            The GeneXpert MRSA Assay (FDA     approved for NASAL specimens     only), is one component of a     comprehensive  MRSA colonization     surveillance program. It is not     intended to diagnose MRSA     infection nor to guide or     monitor treatment for     MRSA infections.     Studies: Dg Tibia/fibula Right  08/13/2013   *RADIOLOGY REPORT*  Clinical Data: Leg pain, concern for osteomyelitis  RIGHT TIBIA AND FIBULA - 2 VIEW  Comparison: None.  Findings: Post-traumatic deformity is seen throughout the right leg, involving the fibular diaphysis and distal tibiofibular syndesmosis and right ankle.  Two lag fixation screws traverse the right ankle joint. No periprosthetic lucency to suggest loosening or infection is identified.  There is soft tissue swelling at the medial malleolus.  No definite osseous erosion to suggest osteomyelitis identified.  There is no periosteal reaction or subcutaneous emphysema.  Multiple vascular clips are seen throughout the leg.  Severe degenerative osteoarthrosis with heterotopic bone formation is seen about the right knee. There is soft tissue swelling at the posterior aspect of the proximal right leg.  Diffuse osteopenia is present throughout the leg, likely secondary to disuse.  No acute fracture is identified, although evaluation is somewhat limited due to osteopenia.  IMPRESSION: 1.  Prominent soft tissue swelling at the medial malleolus without osseous erosion to suggest acute osteomyelitis. 2.  Post-traumatic deformity about the right leg with with two lag fixation screws traversing the right ankle joint.  No evidence of hardware complication. 3.  Diffuse osteopenia, likely related to disuse. 4.  Severe osteoarthrosis and heterotopic bone formation about the right knee joint, incompletely evaluated.   Original Report Authenticated By: Rise Mu, M.D.    Scheduled Meds: . enoxaparin (LOVENOX) injection  50 mg Subcutaneous Q24H  . gabapentin  300 mg Oral TID  . multivitamin with minerals  1 tablet Oral Daily  . pantoprazole  40 mg Oral Daily  . pneumococcal 23 valent  vaccine  0.5 mL Intramuscular Tomorrow-1000  . sertraline  50 mg Oral Daily  . sodium chloride  3 mL Intravenous Q12H  . vancomycin  1,000 mg Intravenous Q12H   Continuous Infusions:   Principal  Problem:   Cellulitis Active Problems:   GERD (gastroesophageal reflux disease)   Neuropathy   H/O skin graft   S/P AKA (above knee amputation)    Time spent: 35 minutes.    Chaya Jan  [facility] Pager [phone]  If 7PM-7AM, please contact night-coverage at [email or web address], password [facility] 08/14/2013, 5:10 PM  LOS: 2 days
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
## 2013-08-15 DIAGNOSIS — D72829 Elevated white blood cell count, unspecified: Secondary | ICD-10-CM | POA: Diagnosis present

## 2013-08-15 DIAGNOSIS — E876 Hypokalemia: Secondary | ICD-10-CM

## 2013-08-15 DIAGNOSIS — G589 Mononeuropathy, unspecified: Secondary | ICD-10-CM

## 2013-08-15 LAB — BASIC METABOLIC PANEL
BUN: 6 mg/dL (ref 6–23)
Calcium: 9.6 mg/dL (ref 8.4–10.5)
Creatinine, Ser: 0.99 mg/dL (ref 0.50–1.35)
GFR calc non Af Amer: >90 mL/min (ref >90)
Glucose, Bld: 121 mg/dL — ABNORMAL HIGH (ref 70–99)
Sodium: 135 mEq/L (ref 135–145)

## 2013-08-15 LAB — CBC
MCH: 28.2 pg (ref 26.0–34.0)
MCHC: 33.8 g/dL (ref 30.0–36.0)
MCV: 83.4 fL (ref 78.0–100.0)
Platelets: 190 10*3/uL (ref 150–400)
RBC: 5.47 MIL/uL (ref 4.22–5.81)
RDW: 13.4 % (ref 11.5–15.5)

## 2013-08-15 LAB — GLUCOSE, CAPILLARY: Glucose-Capillary: 142 mg/dL — ABNORMAL HIGH (ref 70–99)

## 2013-08-15 MED ORDER — IBUPROFEN 600 MG PO TABS
600.0000 mg | ORAL_TABLET | Freq: Four times a day (QID) | ORAL | Status: DC | PRN
  Filled 2013-08-15: qty 1

## 2013-08-15 MED ORDER — TRAMADOL HCL 50 MG PO TABS: 50.0000 mg | ORAL_TABLET | Freq: Four times a day (QID) | ORAL | Status: DC | PRN

## 2013-08-15 MED ORDER — HYDROCODONE-ACETAMINOPHEN 5-325 MG PO TABS
1.0000 | ORAL_TABLET | Freq: Four times a day (QID) | ORAL | Status: DC | PRN
  Administered 2013-08-15: 2 via ORAL
  Filled 2013-08-15 (×2): qty 2

## 2013-08-15 NOTE — Progress Notes (Signed)
IV infiltrated, left hand is red.  Spoke with IV team and was told to have patient elevate hand and apply cold compress as needed.  New IV site placed, will continue to monitor.  Ernesta Amble, RN

## 2013-08-15 NOTE — Progress Notes (Signed)
Pt complaining of pain in coccyx and right leg.  States that the Ultram doesn't work at all and needs something else.  Dr. Truitt Merle, new orders for Vicodin received but told pt that this medicine would only be available while he is hospitalized, no prescription when discharged.  I went to give pt the medicine and he refused it stating that the MD had called to clarify the above statement about pain med and he was made to feel like a "druggie" therefore did not want anything for pain.  I tried to encourage other forms of pain relief, ie, heat/cold, other meds ordered, but patient stated he wanted nothing.  Will continue to monitor and offer other support.
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
## 2013-08-16 LAB — CBC
MCH: 27.5 pg (ref 26.0–34.0)
MCHC: 33.3 g/dL (ref 30.0–36.0)
MCV: 82.7 fL (ref 78.0–100.0)
Platelets: 186 10*3/uL (ref 150–400)

## 2013-08-16 LAB — GLUCOSE, CAPILLARY: Glucose-Capillary: 125 mg/dL — ABNORMAL HIGH (ref 70–99)

## 2013-08-16 MED ORDER — HYDROCODONE-ACETAMINOPHEN 5-325 MG PO TABS: 1.0000 | ORAL_TABLET | Freq: Four times a day (QID) | ORAL | Status: DC | PRN

## 2013-08-16 MED ORDER — IBUPROFEN 800 MG PO TABS: 800.0000 mg | ORAL_TABLET | Freq: Four times a day (QID) | ORAL | Status: DC | PRN

## 2013-08-16 MED ORDER — DOXYCYCLINE HYCLATE 100 MG PO TABS: 100.0000 mg | ORAL_TABLET | Freq: Two times a day (BID) | ORAL | Status: DC
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
## 2013-08-20 LAB — CULTURE, BLOOD (ROUTINE X 2): Culture: NO GROWTH

## 2013-08-30 ENCOUNTER — Encounter: Payer: Self-pay | Admitting: Internal Medicine

## 2013-08-30 ENCOUNTER — Ambulatory Visit: Payer: Medicaid Other | Attending: Internal Medicine | Admitting: Internal Medicine

## 2013-08-30 VITALS — BP 117/83 | HR 76 | Temp 98.1°F

## 2013-08-30 DIAGNOSIS — M545 Low back pain, unspecified: Secondary | ICD-10-CM | POA: Insufficient documentation

## 2013-08-30 DIAGNOSIS — G629 Polyneuropathy, unspecified: Secondary | ICD-10-CM

## 2013-08-30 DIAGNOSIS — G589 Mononeuropathy, unspecified: Secondary | ICD-10-CM

## 2013-08-30 DIAGNOSIS — M533 Sacrococcygeal disorders, not elsewhere classified: Secondary | ICD-10-CM | POA: Insufficient documentation

## 2013-08-30 DIAGNOSIS — N529 Male erectile dysfunction, unspecified: Secondary | ICD-10-CM

## 2013-08-30 DIAGNOSIS — G8929 Other chronic pain: Secondary | ICD-10-CM | POA: Insufficient documentation

## 2013-08-30 DIAGNOSIS — K219 Gastro-esophageal reflux disease without esophagitis: Secondary | ICD-10-CM | POA: Insufficient documentation

## 2013-08-30 DIAGNOSIS — F17200 Nicotine dependence, unspecified, uncomplicated: Secondary | ICD-10-CM | POA: Insufficient documentation

## 2013-08-30 DIAGNOSIS — Z760 Encounter for issue of repeat prescription: Secondary | ICD-10-CM | POA: Insufficient documentation

## 2013-08-30 DIAGNOSIS — Z79899 Other long term (current) drug therapy: Secondary | ICD-10-CM | POA: Insufficient documentation

## 2013-08-30 DIAGNOSIS — S88119A Complete traumatic amputation at level between knee and ankle, unspecified lower leg, initial encounter: Secondary | ICD-10-CM | POA: Insufficient documentation

## 2013-08-30 MED ORDER — OMEPRAZOLE 40 MG PO CPDR: 40.0000 mg | DELAYED_RELEASE_CAPSULE | Freq: Every day | ORAL | Status: DC

## 2013-08-30 MED ORDER — SILDENAFIL CITRATE 100 MG PO TABS: 50.0000 mg | ORAL_TABLET | Freq: Every day | ORAL | Status: DC | PRN

## 2013-08-30 MED ORDER — GABAPENTIN 300 MG PO CAPS: 300.0000 mg | ORAL_CAPSULE | Freq: Three times a day (TID) | ORAL | Status: DC

## 2013-08-30 MED ORDER — TRAMADOL HCL 50 MG PO TABS: 50.0000 mg | ORAL_TABLET | Freq: Three times a day (TID) | ORAL | Status: DC | PRN
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
## 2013-08-30 NOTE — Addendum Note (Signed)
Addended by: Eddie North on: 08/30/2013 01:07 PM   Modules accepted: Orders

## 2013-09-03 ENCOUNTER — Telehealth: Payer: Self-pay | Admitting: Internal Medicine

## 2013-09-03 NOTE — Telephone Encounter (Signed)
Pts paperwork for powered wheelchair was filled out and faxed to [facility], [phone] on 08/31/13.

## 2013-10-02 ENCOUNTER — Ambulatory Visit: Payer: Medicaid Other

## 2013-10-02 ENCOUNTER — Telehealth: Payer: Self-pay | Admitting: *Deleted

## 2013-10-02 NOTE — Telephone Encounter (Signed)
Pt rescheduled flu shot for 10/03/13.

## 2013-10-03 ENCOUNTER — Ambulatory Visit: Payer: Medicaid Other | Attending: Internal Medicine

## 2013-10-03 ENCOUNTER — Telehealth: Payer: Self-pay | Admitting: *Deleted

## 2013-10-03 DIAGNOSIS — Z0000 Encounter for general adult medical examination without abnormal findings: Secondary | ICD-10-CM

## 2013-10-03 NOTE — Progress Notes (Unsigned)
CSW met with patient in order to help with ED medication.  CSW reviewed chart and assessed patient needs.  CSW identified that patient needed a medication PA which is managed by the Clinic RN.  CSW transitioned this to clinic RN for further processing.  CSW initiated application for Handicap placard.  CSW will transition to physician for signature.    CSW will continue to follow as needed.  Beverly Sessions  Duration 45 min

## 2013-10-03 NOTE — Telephone Encounter (Signed)
[employer] called regarding [name] and needing more clinical information in order for [name] to receive a wheel chair. Mr Isaac Thomas had an office visit today. So there were more notations and information today that will allow [name] to receive a wheelchair. I told him to call me back if there is any trouble.

## 2013-10-10 ENCOUNTER — Other Ambulatory Visit: Payer: Self-pay | Admitting: Internal Medicine

## 2013-10-10 MED ORDER — TADALAFIL 20 MG PO TABS: 10.0000 mg | ORAL_TABLET | Freq: Every day | ORAL | Status: DC | PRN

## 2013-10-15 ENCOUNTER — Other Ambulatory Visit: Payer: Self-pay | Admitting: Internal Medicine

## 2013-10-15 MED ORDER — SILDENAFIL CITRATE 20 MG PO TABS: 60.0000 mg | ORAL_TABLET | Freq: Every day | ORAL | Status: DC | PRN

## 2013-10-31 ENCOUNTER — Ambulatory Visit: Payer: Medicaid Other | Attending: Internal Medicine | Admitting: Internal Medicine

## 2013-10-31 ENCOUNTER — Encounter: Payer: Self-pay | Admitting: Internal Medicine

## 2013-10-31 VITALS — BP 119/81 | HR 84 | Temp 98.2°F | Resp 16 | Ht 68.0 in | Wt 245.0 lb

## 2013-10-31 DIAGNOSIS — Z89612 Acquired absence of left leg above knee: Secondary | ICD-10-CM

## 2013-10-31 DIAGNOSIS — G629 Polyneuropathy, unspecified: Secondary | ICD-10-CM

## 2013-10-31 DIAGNOSIS — N529 Male erectile dysfunction, unspecified: Secondary | ICD-10-CM

## 2013-10-31 DIAGNOSIS — K219 Gastro-esophageal reflux disease without esophagitis: Secondary | ICD-10-CM

## 2013-10-31 DIAGNOSIS — F329 Major depressive disorder, single episode, unspecified: Secondary | ICD-10-CM

## 2013-10-31 MED ORDER — GABAPENTIN 300 MG PO CAPS: 600.0000 mg | ORAL_CAPSULE | Freq: Three times a day (TID) | ORAL | Status: DC

## 2013-10-31 MED ORDER — TRAMADOL HCL 50 MG PO TABS: 50.0000 mg | ORAL_TABLET | Freq: Four times a day (QID) | ORAL | Status: DC | PRN

## 2013-10-31 MED ORDER — FLUTICASONE PROPIONATE 50 MCG/ACT NA SUSP: 2.0000 | Freq: Every day | NASAL | Status: DC

## 2013-10-31 MED ORDER — OMEPRAZOLE 40 MG PO CPDR: 40.0000 mg | DELAYED_RELEASE_CAPSULE | Freq: Two times a day (BID) | ORAL | Status: DC

## 2013-10-31 MED ORDER — IBUPROFEN 800 MG PO TABS: 800.0000 mg | ORAL_TABLET | Freq: Three times a day (TID) | ORAL | Status: DC | PRN

## 2013-10-31 MED ORDER — SERTRALINE HCL 50 MG PO TABS: 50.0000 mg | ORAL_TABLET | Freq: Every day | ORAL | Status: DC
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
## 2013-10-31 NOTE — Progress Notes (Unsigned)
Following up on the leg and back pain. Pt needs his medications refilled.

## 2013-11-23 ENCOUNTER — Telehealth: Payer: Self-pay | Admitting: Internal Medicine

## 2013-11-23 NOTE — Telephone Encounter (Signed)
Patient called in order to request refill on Sildenafil prescriptions.  Original prescription written over 30 days ago with no refills. Please contact patient if appointment needs to be scheduled or if medication can be escribed to pharmacy for ongoing treatment of ED.

## 2014-02-28 ENCOUNTER — Ambulatory Visit: Payer: Medicaid Other

## 2014-03-03 ENCOUNTER — Encounter (HOSPITAL_COMMUNITY): Payer: Self-pay | Admitting: Emergency Medicine

## 2014-03-03 ENCOUNTER — Emergency Department (HOSPITAL_COMMUNITY)
Admission: EM | Admit: 2014-03-03 | Discharge: 2014-03-03 | Disposition: A | Discharge status: Home / Self Care | Payer: Medicaid Other | Attending: Emergency Medicine | Admitting: Emergency Medicine

## 2014-03-03 DIAGNOSIS — IMO0002 Reserved for concepts with insufficient information to code with codable children: Secondary | ICD-10-CM | POA: Insufficient documentation

## 2014-03-03 DIAGNOSIS — F329 Major depressive disorder, single episode, unspecified: Secondary | ICD-10-CM | POA: Insufficient documentation

## 2014-03-03 DIAGNOSIS — F17200 Nicotine dependence, unspecified, uncomplicated: Secondary | ICD-10-CM | POA: Insufficient documentation

## 2014-03-03 DIAGNOSIS — M545 Low back pain, unspecified: Secondary | ICD-10-CM | POA: Insufficient documentation

## 2014-03-03 DIAGNOSIS — Z79899 Other long term (current) drug therapy: Secondary | ICD-10-CM | POA: Insufficient documentation

## 2014-03-03 DIAGNOSIS — Z9889 Other specified postprocedural states: Secondary | ICD-10-CM | POA: Insufficient documentation

## 2014-03-03 DIAGNOSIS — F3289 Other specified depressive episodes: Secondary | ICD-10-CM | POA: Insufficient documentation

## 2014-03-03 LAB — URINALYSIS, ROUTINE W REFLEX MICROSCOPIC
Bilirubin Urine: NEGATIVE
GLUCOSE, UA: NEGATIVE mg/dL
Hgb urine dipstick: NEGATIVE
KETONES UR: NEGATIVE mg/dL
LEUKOCYTES UA: NEGATIVE
NITRITE: NEGATIVE
PH: 5.5 (ref 5.0–8.0)
Protein, ur: NEGATIVE mg/dL
SPECIFIC GRAVITY, URINE: 1.038 — AB (ref 1.005–1.030)
Urobilinogen, UA: 0.2 mg/dL (ref 0.0–1.0)

## 2014-03-03 MED ORDER — PREDNISONE 20 MG PO TABS
60.0000 mg | ORAL_TABLET | Freq: Once | ORAL | Status: AC
  Administered 2014-03-03: 60 mg via ORAL
  Filled 2014-03-03: qty 3

## 2014-03-03 MED ORDER — CYCLOBENZAPRINE HCL 10 MG PO TABS: 5.0000 mg | ORAL_TABLET | Freq: Two times a day (BID) | ORAL | Status: DC | PRN

## 2014-03-03 MED ORDER — KETOROLAC TROMETHAMINE 30 MG/ML IJ SOLN
60.0000 mg | Freq: Once | INTRAMUSCULAR | Status: AC
  Administered 2014-03-03: 60 mg via INTRAMUSCULAR
  Filled 2014-03-03: qty 2

## 2014-03-03 MED ORDER — CYCLOBENZAPRINE HCL 10 MG PO TABS
5.0000 mg | ORAL_TABLET | Freq: Once | ORAL | Status: AC
  Administered 2014-03-03: 5 mg via ORAL
  Filled 2014-03-03: qty 1

## 2014-03-03 MED ORDER — PREDNISONE 10 MG PO TABS: 20.0000 mg | ORAL_TABLET | Freq: Every day | ORAL | Status: DC

## 2014-03-03 MED ORDER — PROMETHAZINE HCL 25 MG PO TABS
25.0000 mg | ORAL_TABLET | Freq: Once | ORAL | Status: AC
  Administered 2014-03-03: 25 mg via ORAL
  Filled 2014-03-03: qty 1

## 2014-03-03 NOTE — ED Provider Notes (Signed)
CSN: [id number]     Arrival date & time 03/03/14  1245 History   First MD Initiated Contact with Patient 03/03/14 1336     Chief Complaint  Patient presents with  . Flank Pain     (Consider location/radiation/quality/duration/timing/severity/associated sxs/prior Treatment) HPI  Patient presents to the ED with complaints of left flank pain that has been persisting for 3 days. It is works with movement and staying in one position for a prolonged period of time. The patient uses a powered wheel chair and often has to transfer himself from bed, to chair, to toilet. It has been painful for him to do so. He denies mid back tenderness. He denies urinary or bowel incontinence. He has not had any abdominal pain, dysuria, hematuria, nausea, vomiting or diarrhea.  Past Medical History  Diagnosis Date  . 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 Depression    Past Surgical History  Procedure Laterality Date  . Skin graft    . Abdominal surgery    . Above knee leg amputation Left   . Foot fusion Right    Family History  Problem Relation Age of Onset  . Hypertension Mother    History  Substance Use Topics  . Smoking status: Current Some Day Smoker    Types: Cigarettes  . Smokeless tobacco: Not on file     Comment: 1- 2 per day  . Alcohol Use: No    Review of Systems  The patient denies anorexia, fever, weight loss,, vision loss, decreased hearing, hoarseness, chest pain, syncope, dyspnea on exertion, peripheral edema, balance deficits, hemoptysis, abdominal pain, melena, hematochezia, severe indigestion/heartburn, hematuria, incontinence, genital sores, muscle weakness, suspicious skin lesions, transient blindness, , depression, unusual weight change, abnormal bleeding, enlarged lymph nodes, angioedema, and breast masses.   Allergies  Morphine and related  Home Medications   Current Outpatient Rx  Name  Route  Sig  Dispense  Refill  . ALPRAZolam (XANAX) 1 MG tablet   Oral   Take  1 mg by mouth 2 (two) times daily as needed for anxiety.         . fluticasone (FLONASE) 50 MCG/ACT nasal spray   Each Nare   Place 2 sprays into both nostrils daily.   16 g   6   . gabapentin (NEURONTIN) 300 MG capsule   Oral   Take 2 capsules (600 mg total) by mouth 3 (three) times daily.   180 capsule   3   . HYDROcodone-acetaminophen (NORCO/VICODIN) 5-325 MG per tablet   Oral   Take 1 tablet by mouth every 6 (six) hours as needed (Moderate to severe pain).   15 tablet   0   . ibuprofen (ADVIL,MOTRIN) 800 MG tablet   Oral   Take 1 tablet (800 mg total) by mouth every 8 (eight) hours as needed for fever or mild pain (Mild pain). Please take with food   30 tablet   3   . omeprazole (PRILOSEC) 40 MG capsule   Oral   Take 1 capsule (40 mg total) by mouth 2 (two) times daily.   60 capsule   3    BP 115/74  Pulse 68  Temp(Src) 97.9 F (36.6 C) (Oral)  Resp 20  SpO2 98% Physical Exam  Nursing note and vitals reviewed. Constitutional: He appears well-developed and well-nourished. No distress.  HENT:  Head: Normocephalic and atraumatic.  Eyes: Pupils are equal, round, and reactive to light.  Neck: Normal range of motion. Neck  supple.  Cardiovascular: Normal rate and regular rhythm.   Pulmonary/Chest: Effort normal.  Abdominal: Soft. He exhibits no distension. There is no tenderness. There is no rebound.  Musculoskeletal:       Back:  Neurological: He is alert.  Skin: Skin is warm and dry.    ED Course  Procedures (including critical care time) Labs Review Labs Reviewed  URINALYSIS, ROUTINE W REFLEX MICROSCOPIC - Abnormal; Notable for the following:    Specific Gravity, Urine 1.038 (*)    All other components within normal limits   Imaging Review No results found.   EKG Interpretation None      MDM   Final diagnoses:  None    Patient urinalysis shows not UTI suggesting pyelo and no hemoglobin suggesting stones. His pain is most consistent with  a back muscle strain. The urinalysis supports this. He was given 60 mg IM Toradol. He reports that this completely relieved his pain and he is able to transfer himself with no difficulty. He was most concerned that he was having a kidney problem since his mom suggested that is why he is hurting.  He feels comfortable going home. He has a PCP that he can follow-up with if he continues to have any concerns.  43 y.o.Everson Roane's  with back pain. No neurological deficits and normal neuro exam. Patient can walk but states is painful. No loss of bowel or bladder control. No concern for cauda equina. No fever, night sweats, weight loss, h/o cancer, IVDU. RICE protocol and pain medicine indicated and discussed with patient.   Patient Plan 1. Medications: narcotic pain medication, muscle relaxer and usual home medications  2. Treatment: rest, drink plenty of fluids, gentle stretching as discussed, alternate ice and heat  3. Follow Up: Please followup with your primary doctor for discussion of your diagnoses and further evaluation after today's visit; if you do not have a primary care doctor use the resource guide provided to find one   Vital signs are stable at discharge. Filed Vitals:   03/03/14 1521  BP: 111/73  Pulse: 80  Temp: 98.2 F (36.8 C)  Resp: 18    Patient/guardian has voiced understanding and agreed to follow-up with the PCP or specialist.         Linus Mako, PA-C 03/03/14 1608

## 2014-03-03 NOTE — ED Notes (Signed)
Pt reports left flank pain x 3 days, denies fever, n/v, denies urinary symptoms.

## 2014-03-03 NOTE — Discharge Instructions (Signed)
Back Pain, Adult °Low back pain is very common. About 1 in 5 people have back pain. The cause of low back pain is rarely dangerous. The pain often gets better over time. About half of people with a sudden onset of back pain feel better in just 2 weeks. About 8 in 10 people feel better by 6 weeks.  °CAUSES °Some common causes of back pain include: °· Strain of the muscles or ligaments supporting the spine. °· Wear and tear (degeneration) of the spinal discs. °· Arthritis. °· Direct injury to the back. °DIAGNOSIS °Most of the time, the direct cause of low back pain is not known. However, back pain can be treated effectively even when the exact cause of the pain is unknown. Answering your caregiver's questions about your overall health and symptoms is one of the most accurate ways to make sure the cause of your pain is not dangerous. If your caregiver needs more information, he or she may order lab work or imaging tests (X-rays or MRIs). However, even if imaging tests show changes in your back, this usually does not require surgery. °HOME CARE INSTRUCTIONS °For many people, back pain returns. Since low back pain is rarely dangerous, it is often a condition that people can learn to manage on their own.  °· Remain active. It is stressful on the back to sit or stand in one place. Do not sit, drive, or stand in one place for more than 30 minutes at a time. Take short walks on level surfaces as soon as pain allows. Try to increase the length of time you walk each day. °· Do not stay in bed. Resting more than 1 or 2 days can delay your recovery. °· Do not avoid exercise or work. Your body is made to move. It is not dangerous to be active, even though your back may hurt. Your back will likely heal faster if you return to being active before your pain is gone. °· Pay attention to your body when you  bend and lift. Many people have less discomfort when lifting if they bend their knees, keep the load close to their bodies, and  avoid twisting. Often, the most comfortable positions are those that put less stress on your recovering back. °· Find a comfortable position to sleep. Use a firm mattress and lie on your side with your knees slightly bent. If you lie on your back, put a pillow under your knees. °· Only take over-the-counter or prescription medicines as directed by your caregiver. Over-the-counter medicines to reduce pain and inflammation are often the most helpful. Your caregiver may prescribe muscle relaxant drugs. These medicines help dull your pain so you can more quickly return to your normal activities and healthy exercise. °· Put ice on the injured area. °· Put ice in a plastic bag. °· Place a towel between your skin and the bag. °· Leave the ice on for 15-20 minutes, 03-04 times a day for the first 2 to 3 days. After that, ice and heat may be alternated to reduce pain and spasms. °· Ask your caregiver about trying back exercises and gentle massage. This may be of some benefit. °· Avoid feeling anxious or stressed. Stress increases muscle tension and can worsen back pain. It is important to recognize when you are anxious or stressed and learn ways to manage it. Exercise is a great option. °SEEK MEDICAL CARE IF: °· You have pain that is not relieved with rest or medicine. °· You have pain that does not improve in 1 week. °· You have new symptoms. °· You are generally not feeling well. °SEEK   IMMEDIATE MEDICAL CARE IF:  °· You have pain that radiates from your back into your legs. °· You develop new bowel or bladder control problems. °· You have unusual weakness or numbness in your arms or legs. °· You develop nausea or vomiting. °· You develop abdominal pain. °· You feel faint. °Document Released: 11/29/2005 Document Revised: 05/30/2012 Document Reviewed: 04/19/2011 °ExitCare® Patient Information ©2014 [employer]. ° °Lumbosacral Strain °Lumbosacral strain is a strain of any of the parts that make up your lumbosacral  vertebrae. Your lumbosacral vertebrae are the bones that make up the lower third of your backbone. Your lumbosacral vertebrae are held together by muscles and tough, fibrous tissue (ligaments).  °CAUSES  °A sudden blow to your back can cause lumbosacral strain. Also, anything that causes an excessive stretch of the muscles in the low back can cause this strain. This is typically seen when people exert themselves strenuously, fall, lift heavy objects, bend, or crouch repeatedly. °RISK FACTORS °· Physically demanding work. °· Participation in pushing or pulling sports or sports that require sudden twist of the back (tennis, golf, baseball). °· Weight lifting. °· Excessive lower back curvature. °· Forward-tilted pelvis. °· Weak back or abdominal muscles or both. °· Tight hamstrings. °SIGNS AND SYMPTOMS  °Lumbosacral strain may cause pain in the area of your injury or pain that moves (radiates) down your leg.  °DIAGNOSIS °Your health care provider can often diagnose lumbosacral strain through a physical exam. In some cases, you may need tests such as X-ray exams.  °TREATMENT  °Treatment for your lower back injury depends on many factors that your clinician will have to evaluate. However, most treatment will include the use of anti-inflammatory medicines. °HOME CARE INSTRUCTIONS  °· Avoid hard physical activities (tennis, racquetball, waterskiing) if you are not in proper physical condition for it. This may aggravate or create problems. °· If you have a back problem, avoid sports requiring sudden body movements. Swimming and walking are generally safer activities. °· Maintain good posture. °· Maintain a healthy weight. °· For acute conditions, you may put ice on the injured area. °· Put ice in a plastic bag. °· Place a towel between your skin and the bag. °· Leave the ice on for 20 minutes, 2 3 times a day. °· When the low back starts healing, stretching and strengthening exercises may be recommended. °SEEK MEDICAL CARE  IF: °· Your back pain is getting worse. °· You experience severe back pain not relieved with medicines. °SEEK IMMEDIATE MEDICAL CARE IF:  °· You have numbness, tingling, weakness, or problems with the use of your arms or legs. °· There is a change in bowel or bladder control. °· You have increasing pain in any area of the body, including your belly (abdomen). °· You notice shortness of breath, dizziness, or feel faint. °· You feel sick to your stomach (nauseous), are throwing up (vomiting), or become sweaty. °· You notice discoloration of your toes or legs, or your feet get very cold. °MAKE SURE YOU:  °· Understand these instructions. °· Will watch your condition. °· Will get help right away if you are not doing well or get worse. °Document Released: 09/08/2005 Document Revised: 09/19/2013 Document Reviewed: 07/18/2013 °ExitCare® Patient Information ©2014 [employer]. ° °

## 2014-03-04 NOTE — ED Provider Notes (Signed)
Medical screening examination/treatment/procedure(s) were performed by non-physician practitioner and as supervising physician I was immediately available for consultation/collaboration.   Houston Siren III, MD 03/04/14 [phone]

## 2014-04-16 ENCOUNTER — Encounter (HOSPITAL_COMMUNITY): Payer: Self-pay | Admitting: Emergency Medicine

## 2014-04-16 ENCOUNTER — Emergency Department (HOSPITAL_COMMUNITY)
Admission: EM | Admit: 2014-04-16 | Discharge: 2014-04-17 | Disposition: A | Discharge status: Home / Self Care | Payer: Medicaid Other | Attending: Emergency Medicine | Admitting: Emergency Medicine

## 2014-04-16 DIAGNOSIS — Z79899 Other long term (current) drug therapy: Secondary | ICD-10-CM | POA: Insufficient documentation

## 2014-04-16 DIAGNOSIS — R45851 Suicidal ideations: Secondary | ICD-10-CM | POA: Insufficient documentation

## 2014-04-16 DIAGNOSIS — IMO0002 Reserved for concepts with insufficient information to code with codable children: Secondary | ICD-10-CM | POA: Insufficient documentation

## 2014-04-16 DIAGNOSIS — F3289 Other specified depressive episodes: Secondary | ICD-10-CM | POA: Insufficient documentation

## 2014-04-16 DIAGNOSIS — F332 Major depressive disorder, recurrent severe without psychotic features: Secondary | ICD-10-CM

## 2014-04-16 DIAGNOSIS — F17200 Nicotine dependence, unspecified, uncomplicated: Secondary | ICD-10-CM | POA: Insufficient documentation

## 2014-04-16 DIAGNOSIS — F329 Major depressive disorder, single episode, unspecified: Secondary | ICD-10-CM | POA: Insufficient documentation

## 2014-04-16 LAB — CBC WITH DIFFERENTIAL/PLATELET
BASOS PCT: 0 % (ref 0–1)
Basophils Absolute: 0 10*3/uL (ref 0.0–0.1)
Eosinophils Absolute: 0.1 10*3/uL (ref 0.0–0.7)
Eosinophils Relative: 1 % (ref 0–5)
HEMATOCRIT: 51 % (ref 39.0–52.0)
HEMOGLOBIN: 17.3 g/dL — AB (ref 13.0–17.0)
LYMPHS PCT: 24 % (ref 12–46)
Lymphs Abs: 1.9 10*3/uL (ref 0.7–4.0)
MCH: 28 pg (ref 26.0–34.0)
MCHC: 33.9 g/dL (ref 30.0–36.0)
MCV: 82.5 fL (ref 78.0–100.0)
MONOS PCT: 9 % (ref 3–12)
Monocytes Absolute: 0.7 10*3/uL (ref 0.1–1.0)
NEUTROS ABS: 5.4 10*3/uL (ref 1.7–7.7)
NEUTROS PCT: 66 % (ref 43–77)
Platelets: 258 10*3/uL (ref 150–400)
RBC: 6.18 MIL/uL — AB (ref 4.22–5.81)
RDW: 13.7 % (ref 11.5–15.5)
WBC: 8.1 10*3/uL (ref 4.0–10.5)

## 2014-04-16 LAB — COMPREHENSIVE METABOLIC PANEL
ALBUMIN: 4.6 g/dL (ref 3.5–5.2)
ALK PHOS: 99 U/L (ref 39–117)
ALT: 52 U/L (ref 0–53)
AST: 64 U/L — AB (ref 0–37)
BILIRUBIN TOTAL: 0.7 mg/dL (ref 0.3–1.2)
BUN: 12 mg/dL (ref 6–23)
CHLORIDE: 102 meq/L (ref 96–112)
CO2: 25 meq/L (ref 19–32)
Calcium: 10.2 mg/dL (ref 8.4–10.5)
Creatinine, Ser: 1.01 mg/dL (ref 0.50–1.35)
GFR calc Af Amer: >90 mL/min (ref >90)
GFR, EST NON AFRICAN AMERICAN: 89 mL/min — AB (ref >90)
Glucose, Bld: 86 mg/dL (ref 70–99)
POTASSIUM: 3.5 meq/L — AB (ref 3.7–5.3)
Sodium: 140 mEq/L (ref 137–147)
Total Protein: 8.8 g/dL — ABNORMAL HIGH (ref 6.0–8.3)

## 2014-04-16 LAB — RAPID URINE DRUG SCREEN, HOSP PERFORMED
Amphetamines: NOT DETECTED
BARBITURATES: NOT DETECTED
Benzodiazepines: POSITIVE — AB
Cocaine: POSITIVE — AB
OPIATES: NOT DETECTED
TETRAHYDROCANNABINOL: NOT DETECTED

## 2014-04-16 LAB — ETHANOL: Alcohol, Ethyl (B): <11 mg/dL (ref 0–11)

## 2014-04-16 MED ORDER — ONDANSETRON HCL 4 MG PO TABS
4.0000 mg | ORAL_TABLET | Freq: Three times a day (TID) | ORAL | Status: DC | PRN
Start: 2014-04-16 — End: 2014-04-17

## 2014-04-16 MED ORDER — LORAZEPAM 1 MG PO TABS
1.0000 mg | ORAL_TABLET | Freq: Three times a day (TID) | ORAL | Status: DC | PRN
  Administered 2014-04-17: 1 mg via ORAL
  Filled 2014-04-16: qty 1

## 2014-04-16 MED ORDER — POTASSIUM CHLORIDE CRYS ER 20 MEQ PO TBCR
40.0000 meq | EXTENDED_RELEASE_TABLET | Freq: Once | ORAL | Status: AC
  Administered 2014-04-16: 40 meq via ORAL
  Filled 2014-04-16: qty 2

## 2014-04-16 MED ORDER — IBUPROFEN 200 MG PO TABS
600.0000 mg | ORAL_TABLET | Freq: Three times a day (TID) | ORAL | Status: DC | PRN
Start: 2014-04-16 — End: 2014-04-17

## 2014-04-16 MED ORDER — NICOTINE 21 MG/24HR TD PT24
21.0000 mg | MEDICATED_PATCH | Freq: Every day | TRANSDERMAL | Status: DC
  Administered 2014-04-16: 21 mg via TRANSDERMAL
  Filled 2014-04-16: qty 1

## 2014-04-16 MED ORDER — ACETAMINOPHEN 325 MG PO TABS
650.0000 mg | ORAL_TABLET | ORAL | Status: DC | PRN
  Administered 2014-04-17: 650 mg via ORAL
  Filled 2014-04-16: qty 2

## 2014-04-16 MED ORDER — ZOLPIDEM TARTRATE 5 MG PO TABS
5.0000 mg | ORAL_TABLET | Freq: Every evening | ORAL | Status: DC | PRN
Start: 2014-04-16 — End: 2014-04-17

## 2014-04-16 MED ORDER — ALUM & MAG HYDROXIDE-SIMETH 200-200-20 MG/5ML PO SUSP: 30.0000 mL | ORAL | Status: DC | PRN
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
## 2014-04-16 NOTE — Consult Note (Signed)
  Review of Systems  Constitutional: Negative.   HENT: Negative.   Eyes: Negative.   Respiratory: Negative.   Cardiovascular: Negative.   Gastrointestinal: Negative.   Genitourinary: Negative.   Musculoskeletal: Positive for back pain.  Skin: Negative.   Neurological: Negative.   Endo/Heme/Allergies: Negative.   Psychiatric/Behavioral: Positive for depression and suicidal ideas.   Complains of chronic back pain, neuralgias in his legs and pain when he tries to wear his prosthesis
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
## 2014-04-16 NOTE — ED Notes (Signed)
Bed: WA25 Expected date:  Expected time:  Means of arrival:  Comments: Hold for triage 3 

## 2014-04-16 NOTE — ED Notes (Signed)
Assumed care of patient Patient resting in position of comfort with eyes closed RR WNL--even and unlabored with equal rise and fall of chest Patient in NAD Side rails up, call bell in reach

## 2014-04-16 NOTE — ED Notes (Signed)
Provided pt with sandwich and water.  °

## 2014-04-16 NOTE — ED Notes (Signed)
Patient's girlfriend given his apartment keys per patient's request.

## 2014-04-16 NOTE — ED Notes (Signed)
Pt and belongings have been wanded 

## 2014-04-16 NOTE — ED Notes (Addendum)
Patient wanded by security, Belongings in locker [phone]

## 2014-04-17 ENCOUNTER — Encounter (HOSPITAL_COMMUNITY): Payer: Self-pay | Admitting: *Deleted

## 2014-04-17 ENCOUNTER — Inpatient Hospital Stay (HOSPITAL_COMMUNITY)
Admission: AD | Admit: 2014-04-17 | Discharge: 2014-04-19 | DRG: 885 | Disposition: A | Discharge status: Home / Self Care | Payer: Medicaid Other | Source: Intra-hospital | Attending: Psychiatry | Admitting: Psychiatry

## 2014-04-17 DIAGNOSIS — G4700 Insomnia, unspecified: Secondary | ICD-10-CM | POA: Diagnosis present

## 2014-04-17 DIAGNOSIS — F321 Major depressive disorder, single episode, moderate: Principal | ICD-10-CM | POA: Diagnosis present

## 2014-04-17 DIAGNOSIS — F17200 Nicotine dependence, unspecified, uncomplicated: Secondary | ICD-10-CM | POA: Diagnosis present

## 2014-04-17 DIAGNOSIS — S78119A Complete traumatic amputation at level between unspecified hip and knee, initial encounter: Secondary | ICD-10-CM

## 2014-04-17 DIAGNOSIS — F329 Major depressive disorder, single episode, unspecified: Secondary | ICD-10-CM | POA: Diagnosis present

## 2014-04-17 DIAGNOSIS — F1410 Cocaine abuse, uncomplicated: Secondary | ICD-10-CM | POA: Diagnosis present

## 2014-04-17 DIAGNOSIS — R45851 Suicidal ideations: Secondary | ICD-10-CM

## 2014-04-17 DIAGNOSIS — K219 Gastro-esophageal reflux disease without esophagitis: Secondary | ICD-10-CM | POA: Diagnosis present

## 2014-04-17 DIAGNOSIS — F411 Generalized anxiety disorder: Secondary | ICD-10-CM | POA: Diagnosis present

## 2014-04-17 DIAGNOSIS — Z8249 Family history of ischemic heart disease and other diseases of the circulatory system: Secondary | ICD-10-CM

## 2014-04-17 DIAGNOSIS — Z87820 Personal history of traumatic brain injury: Secondary | ICD-10-CM

## 2014-04-17 MED ORDER — MAGNESIUM HYDROXIDE 400 MG/5ML PO SUSP: 30.0000 mL | Freq: Every day | ORAL | Status: DC | PRN

## 2014-04-17 MED ORDER — ALUM & MAG HYDROXIDE-SIMETH 200-200-20 MG/5ML PO SUSP: 30.0000 mL | ORAL | Status: DC | PRN

## 2014-04-17 MED ORDER — DULOXETINE HCL 20 MG PO CPEP
20.0000 mg | ORAL_CAPSULE | Freq: Every day | ORAL | Status: DC
  Administered 2014-04-17 – 2014-04-18 (×2): 20 mg via ORAL
  Filled 2014-04-17 (×6): qty 1

## 2014-04-17 MED ORDER — NICOTINE 21 MG/24HR TD PT24
21.0000 mg | MEDICATED_PATCH | Freq: Every day | TRANSDERMAL | Status: DC
  Administered 2014-04-17 – 2014-04-18 (×2): 21 mg via TRANSDERMAL
  Filled 2014-04-17 (×4): qty 1

## 2014-04-17 MED ORDER — GABAPENTIN 300 MG PO CAPS
300.0000 mg | ORAL_CAPSULE | Freq: Two times a day (BID) | ORAL | Status: DC
  Administered 2014-04-17 – 2014-04-19 (×4): 300 mg via ORAL
  Filled 2014-04-17 (×2): qty 1
  Filled 2014-04-17: qty 8
  Filled 2014-04-17 (×2): qty 1
  Filled 2014-04-17: qty 8
  Filled 2014-04-17 (×3): qty 1

## 2014-04-17 MED ORDER — PANTOPRAZOLE SODIUM 40 MG PO TBEC
40.0000 mg | DELAYED_RELEASE_TABLET | Freq: Two times a day (BID) | ORAL | Status: DC
  Administered 2014-04-17 – 2014-04-19 (×5): 40 mg via ORAL
  Filled 2014-04-17 (×10): qty 1

## 2014-04-17 MED ORDER — TRAZODONE HCL 50 MG PO TABS
50.0000 mg | ORAL_TABLET | Freq: Every evening | ORAL | Status: DC | PRN
  Administered 2014-04-17 – 2014-04-18 (×2): 50 mg via ORAL
  Filled 2014-04-17: qty 4
  Filled 2014-04-17 (×2): qty 1

## 2014-04-17 MED ORDER — ALPRAZOLAM 1 MG PO TABS
1.0000 mg | ORAL_TABLET | Freq: Two times a day (BID) | ORAL | Status: DC | PRN
  Administered 2014-04-17 – 2014-04-19 (×3): 1 mg via ORAL
  Filled 2014-04-17 (×3): qty 1

## 2014-04-17 MED ORDER — OXYCODONE-ACETAMINOPHEN 5-325 MG PO TABS
1.0000 | ORAL_TABLET | Freq: Three times a day (TID) | ORAL | Status: DC | PRN
  Administered 2014-04-17 – 2014-04-19 (×3): 1 via ORAL
  Filled 2014-04-17 (×3): qty 1

## 2014-04-17 MED ORDER — ACETAMINOPHEN 325 MG PO TABS: 650.0000 mg | ORAL_TABLET | Freq: Four times a day (QID) | ORAL | Status: DC | PRN

## 2014-04-17 MED ORDER — GABAPENTIN 300 MG PO CAPS: 300.0000 mg | ORAL_CAPSULE | Freq: Three times a day (TID) | ORAL | Status: DC

## 2014-04-17 NOTE — BH Assessment (Signed)
[facility] Assessment Progress Note   Patriciaann Clan, PA reviewed patient for admission.  He accepted patient to a 300 hall bed pending bed availability.

## 2014-04-17 NOTE — Progress Notes (Signed)
Attend group 

## 2014-04-17 NOTE — [facility] Suicide Risk Assessment (Signed)
Suicide Risk Assessment  Admission Assessment     Nursing information obtained from:    Demographic factors:    Current Mental Status:    Loss Factors:    Historical Factors:    Risk Reduction Factors:    Total Time spent with patient: 45 minutes  CLINICAL FACTORS:   Depression:   Anhedonia Hopelessness Insomnia Severe Medical Diagnoses and Treatments/Surgeries  PsCOGNITIVE FEATURES THAT CONTRIBUTE TO RISK:  Closed-mindedness Polarized thinking Thought constriction (tunnel vision)    SUICIDE RISK:   Moderate:  Frequent suicidal ideation with limited intensity, and duration, some specificity in terms of plans, no associated intent, good self-control, limited dysphoria/symptomatology, some risk factors present, and identifiable protective factors, including available and accessible social support.  PLAN OF CARE: Supportive approach/copig skills/relapase prevention                              Trial with Cymbalta 20 mg with plans to increase to 30  I certify that inpatient services furnished can reasonably be expected to improve the patient's condition.  Nicholaus Bloom 04/17/2014, 4:29 PM

## 2014-04-17 NOTE — ED Notes (Signed)
Report called to Sharl Ma, RN-
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
## 2014-04-17 NOTE — ED Notes (Signed)
A meal was given to the Pt
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
## 2014-04-17 NOTE — BHH Counselor (Addendum)
Pt accepted to bed 302-1 at [facility]. Support paperwork signed and faxed to [facility]. Originals placed in pt's chart. Erline Levine RN will Audiological scientist for transport.  Arnold Long, [place] Assessment Counselor

## 2014-04-17 NOTE — ED Notes (Signed)
[facility] called for wheelchair transport

## 2014-04-17 NOTE — BHH Group Notes (Signed)
[facility] LCSW Group Therapy  04/17/2014 2:56 PM  Type of Therapy:  Group Therapy  Participation Level:  Did Not Attend-pt recently arrived on unit. In room resting/did not attend afternoon therapy group.   Isaac Thomas LCSWA  04/17/2014, 2:56 PM

## 2014-04-17 NOTE — ED Provider Notes (Signed)
7:17 AM Patient accepted to [facility]. Accepting physician is Dr. Sabra Heck. Stable for transfer.  Ephraim Hamburger, MD 04/17/14 [phone]

## 2014-04-17 NOTE — ED Notes (Signed)
Pt requesting pain and anxiety medication.

## 2014-04-17 NOTE — ED Notes (Signed)
[facility] called for transport to [facility]

## 2014-04-18 MED ORDER — DULOXETINE HCL 30 MG PO CPEP
30.0000 mg | ORAL_CAPSULE | Freq: Every day | ORAL | Status: DC
  Administered 2014-04-19: 30 mg via ORAL
  Filled 2014-04-18: qty 4
  Filled 2014-04-18 (×2): qty 1

## 2014-04-18 NOTE — BHH Group Notes (Signed)
[place] LCSW Group Therapy  04/18/2014 2:31 PM  Type of Therapy:  Group Therapy  Participation Level:  Active  Participation Quality:  Attentive  Affect:  Appropriate  Cognitive:  Alert and Oriented  Insight:  Engaged  Engagement in Therapy:  Engaged  Modes of Intervention:  Confrontation, Discussion, Education, Exploration, Problem-solving, Rapport Building, Socialization and Support  Summary of Progress/Problems:  Finding Balance in Life. Today's group focused on defining balance in one's own words, identifying things that can knock one off balance, and exploring healthy ways to maintain balance in life. Group members were asked to provide an example of a time when they felt off balance, describe how they handled that situation,and process healthier ways to regain balance in the future. Group members were asked to share the most important tool for maintaining balance that they learned while at [facility] and how they plan to apply this method after discharge. Isaac Thomas was attentive and engaged throughout today's therapy group. He shared that he isolates himself for days in his apt and tends to stop taking his meds. Isaac Thomas shared that he has accepted that he needs medication to remain mentally stable and is open to therapy. 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    Isaac Thomas LCSWA  04/18/2014, 2:31 PM

## 2014-04-18 NOTE — Progress Notes (Signed)
Pt observed sitting in the dayroom talking with peers.  He is out of his wheelchair and has his R leg propped on the wheelchair.  He says getting out of the wheelchair on occasion helps his back pain.  He asks for more pain meds, but is informed it will be close to 11pm before any more can be given.  He is also informed of other options, but chooses to wait until then if he is still awake.  Pt is pleasant in conversation.  He says he is here to get back on antidepressant medication.  Pt denies SI/HI/AV.  Pt makes his needs known to staff.  He is interested in outpatient services when he is discharged.  Support and encouragement offered.  Safety maintained with q15 minute checks.

## 2014-04-18 NOTE — Progress Notes (Signed)
Patient ID: Isaac Thomas, male   DOB: [birth year], 43 y.o.   MRN: [id number] He was up for AM meal then went back to bed, Stated" he did not sleep well, was in bed till almost lunch time then got up. Took a shower and put clean scrubs on. He has attented group this afternoon. He refused to fill out his self assesment .Said that his Conservator, museum/gallery is to come and see him this afternoon and the plan is to discharge him Friday.

## 2014-04-18 NOTE — Progress Notes (Signed)
[facility] MD Progress Note  04/18/2014 1:23 PM Isaac Thomas  MRN:  [id number] Subjective:  Isaac Thomas states he got very depressed. State his use of cocaine was a very random thing. Admits he got to isolate and that is when cocaine sneaked back in. Moving forward plans to be more out and about, now that the weather is getting better. States he has the support of his girlfriend but that she is not that much around. He is scheduled to see a psychiatrist/counselor to continue to work on his issues.  Diagnosis:   DSM5: Schizophrenia Disorders:  none Obsessive-Compulsive Disorders:  none Trauma-Stressor Disorders:  none Substance/Addictive Disorders:  Cocaine abuse Depressive Disorders:  Major Depressive Disorder - Moderate (296.22) Total Time spent with patient: 30 minutes  Axis I: Anxiety Disorder NOS  ADL's:  Intact  Sleep: Fair  Appetite:  Fair  Suicidal Ideation:  Plan:  denies Intent:  denies Means:  denies Homicidal Ideation:  Plan:  denies Intent:  denies Means:  denies AEB (as evidenced by):  Psychiatric Specialty Exam: Physical Exam  Review of Systems  Constitutional: Negative.   HENT: Negative.   Eyes: Negative.   Respiratory: Negative.   Cardiovascular: Negative.   Gastrointestinal: Negative.   Genitourinary: Negative.   Musculoskeletal: Negative.   Skin: Negative.   Neurological: Negative.   Endo/Heme/Allergies: Negative.   Psychiatric/Behavioral: Positive for depression and substance abuse. The patient is nervous/anxious.     Blood pressure 117/77, pulse 78, temperature 97.4 F (36.3 C), temperature source Oral, resp. rate 20, height 5\' 8"  (1.727 m), weight 108.863 kg (240 lb), SpO2 96.00%.Body mass index is 36.5 kg/(m^2).  General Appearance: Disheveled  Eye Sport and exercise psychologist::  Fair  Speech:  Clear and Coherent  Volume:  Normal  Mood:  Depressed and worried  Affect:  worried  Thought Process:  Coherent and Goal Directed  Orientation:  Full (Time, Place, and Person)   Thought Content:  symptoms, worries, concerns  Suicidal Thoughts:  No  Homicidal Thoughts:  No  Memory:  Immediate;   Fair Recent;   Fair Remote;   Fair  Judgement:  Fair  Insight:  Present and Shallow  Psychomotor Activity:  Decreased  Concentration:  Fair  Recall:  [employer] of Knowledge:NA  Language: Fair  Akathisia:  No  Handed:    AIMS (if indicated):     Assets:  Desire for Improvement Housing  Sleep:  Number of Hours: 5.75   Musculoskeletal: Strength & Muscle Tone: within normal limits Gait & Station: BKA left leg Patient leans: N/A  Current Medications: Current Facility-Administered Medications  Medication Dose Route Frequency Provider Last Rate Last Dose  . acetaminophen (TYLENOL) tablet 650 mg  650 mg Oral Q6H PRN Nicholaus Bloom, MD      . ALPRAZolam Duanne Moron) tablet 1 mg  1 mg Oral BID PRN Nicholaus Bloom, MD   1 mg at 04/17/14 2136  . alum & mag hydroxide-simeth (MAALOX/MYLANTA) 200-200-20 MG/5ML suspension 30 mL  30 mL Oral Q4H PRN Nicholaus Bloom, MD      . DULoxetine (CYMBALTA) DR capsule 20 mg  20 mg Oral Daily Nicholaus Bloom, MD   20 mg at 04/18/14 0175  . gabapentin (NEURONTIN) capsule 300 mg  300 mg Oral BID Nicholaus Bloom, MD   300 mg at 04/18/14 1025  . magnesium hydroxide (MILK OF MAGNESIA) suspension 30 mL  30 mL Oral Daily PRN Nicholaus Bloom, MD      . nicotine (NICODERM CQ - dosed in  mg/24 hours) patch 21 mg  21 mg Transdermal Daily Nicholaus Bloom, MD   21 mg at 04/18/14 5188  . oxyCODONE-acetaminophen (PERCOCET/ROXICET) 5-325 MG per tablet 1 tablet  1 tablet Oral Q8H PRN Nicholaus Bloom, MD   1 tablet at 04/17/14 1456  . pantoprazole (PROTONIX) EC tablet 40 mg  40 mg Oral BID Nicholaus Bloom, MD   40 mg at 04/18/14 4166  . traZODone (DESYREL) tablet 50 mg  50 mg Oral QHS PRN Nicholaus Bloom, MD   50 mg at 04/17/14 2136    Lab Results:  Results for orders placed during the hospital encounter of 04/16/14 (from the past 48 hour(s))  URINE RAPID DRUG SCREEN (HOSP  PERFORMED)     Status: Abnormal   Collection Time    04/16/14  5:57 PM      Result Value Ref Range   Opiates NONE DETECTED  NONE DETECTED   Cocaine POSITIVE (*) NONE DETECTED   Benzodiazepines POSITIVE (*) NONE DETECTED   Amphetamines NONE DETECTED  NONE DETECTED   Tetrahydrocannabinol NONE DETECTED  NONE DETECTED   Barbiturates NONE DETECTED  NONE DETECTED   Comment:            DRUG SCREEN FOR MEDICAL PURPOSES     ONLY.  IF CONFIRMATION IS NEEDED     FOR ANY PURPOSE, NOTIFY LAB     WITHIN 5 DAYS.                LOWEST DETECTABLE LIMITS     FOR URINE DRUG SCREEN     Drug Class       Cutoff (ng/mL)     Amphetamine      1000     Barbiturate      200     Benzodiazepine   063     Tricyclics       016     Opiates          300     Cocaine          300     THC              50    Physical Findings: AIMS: Facial and Oral Movements Muscles of Facial Expression: None, normal Lips and Perioral Area: None, normal Jaw: None, normal Tongue: None, normal,Extremity Movements Upper (arms, wrists, hands, fingers): None, normal Lower (legs, knees, ankles, toes): None, normal, Trunk Movements Neck, shoulders, hips: None, normal, Overall Severity Severity of abnormal movements (highest score from questions above): None, normal Incapacitation due to abnormal movements: None, normal Patient's awareness of abnormal movements (rate only patient's report): No Awareness, Dental Status Current problems with teeth and/or dentures?: No Does patient usually wear dentures?: No  CIWA:  CIWA-Ar Total: 0 COWS:     Treatment Plan Summary: Daily contact with patient to assess and evaluate symptoms and progress in treatment Medication management  Plan: Supportive approach/coping skills/relapse prevention           Increase the Cymbalta to 30 mg daily  Medical Decision Making Problem Points:  Review of psycho-social stressors (1) Data Points:  Review of new medications or change in dosage (2)  I  certify that inpatient services furnished can reasonably be expected to improve the patient's condition.   Nicholaus Bloom 04/18/2014, 1:23 PM

## 2014-04-18 NOTE — Progress Notes (Signed)
D   Pt is pleasant and cooperative  He has a BKA but is able to take care of his own ADL's   He uses a wheelchair   He reports feeling anxious and depressed he said he will be discharged tomorrow and feels he is ready   He attends and participates in groups and interacts well with other patients A   Verbal support given   Medications administered and effectiveness monitored   Q 15 min checks    R   Pt safe at present

## 2014-04-18 NOTE — Progress Notes (Signed)
Patient did attend the evening karaoke group. Pt was engaged, supportive, and participated by singing a song.  

## 2014-04-18 NOTE — BHH Suicide Risk Assessment (Signed)
[place] INPATIENT:  Family/Significant Other Suicide Prevention Education  Suicide Prevention Education:  Education Completed; Sandrea Hammond (pt's fiance: [phone])  has been identified by the patient as the family member/significant other with whom the patient will be residing, and identified as the person(s) who will aid the patient in the event of a mental health crisis (suicidal ideations/suicide attempt).  With written consent from the patient, the family member/significant other has been provided the following suicide prevention education, prior to the and/or following the discharge of the patient.  The suicide prevention education provided includes the following:  Suicide risk factors  Suicide prevention and interventions  National Suicide Hotline telephone number  [facility] assessment telephone number  [facility] Emergency Assistance [place] and/or Residential Mobile Crisis Unit telephone number  Request made of family/significant other to:  Remove weapons (e.g., guns, rifles, knives), all items previously/currently identified as safety concern.    Remove drugs/medications (over-the-counter, prescriptions, illicit drugs), all items previously/currently identified as a safety concern.  The family member/significant other verbalizes understanding of the suicide prevention education information provided.  The family member/significant other agrees to remove the items of safety concern listed above.  Deaundra Kutzer Smart LCSWA  04/18/2014, 12:40 PM

## 2014-04-18 NOTE — Progress Notes (Signed)
0900 nursing orientation group  The focus of this group is to educate the patient on the purpose and policies of crisis stabilization and provide a format to answer questions about their admission.  The group details unit policies and expectations of patients while admitted.   Pt did not attend. 

## 2014-04-19 DIAGNOSIS — F1420 Cocaine dependence, uncomplicated: Secondary | ICD-10-CM

## 2014-04-19 DIAGNOSIS — F322 Major depressive disorder, single episode, severe without psychotic features: Secondary | ICD-10-CM

## 2014-04-19 MED ORDER — GABAPENTIN 300 MG PO CAPS: 300.0000 mg | ORAL_CAPSULE | Freq: Two times a day (BID) | ORAL | Status: DC

## 2014-04-19 MED ORDER — IBUPROFEN 800 MG PO TABS: 800.0000 mg | ORAL_TABLET | Freq: Three times a day (TID) | ORAL | Status: AC | PRN

## 2014-04-19 MED ORDER — DULOXETINE HCL 30 MG PO CPEP: 60.0000 mg | ORAL_CAPSULE | Freq: Every day | ORAL | Status: DC

## 2014-04-19 MED ORDER — OXYCODONE-ACETAMINOPHEN 5-325 MG PO TABS: 1.0000 | ORAL_TABLET | ORAL | Status: DC | PRN

## 2014-04-19 MED ORDER — DULOXETINE HCL 30 MG PO CPEP: 30.0000 mg | ORAL_CAPSULE | Freq: Every day | ORAL | Status: DC

## 2014-04-19 MED ORDER — TRAZODONE HCL 50 MG PO TABS: 50.0000 mg | ORAL_TABLET | Freq: Every evening | ORAL | Status: DC | PRN

## 2014-04-19 MED ORDER — OMEPRAZOLE 40 MG PO CPDR: 40.0000 mg | DELAYED_RELEASE_CAPSULE | Freq: Every day | ORAL | Status: AC | PRN

## 2014-04-19 MED ORDER — ALPRAZOLAM 1 MG PO TABS: 1.0000 mg | ORAL_TABLET | Freq: Two times a day (BID) | ORAL | Status: DC | PRN

## 2014-04-19 MED ORDER — FLUTICASONE PROPIONATE 50 MCG/ACT NA SUSP: 2.0000 | Freq: Every day | NASAL | Status: AC

## 2014-04-19 NOTE — Progress Notes (Signed)
[facility] Adult Case Management Discharge Plan :  Will you be returning to the same living situation after discharge: Yes,  home  At discharge, do you have transportation home?:Yes,  [facility]-coming between 2-3PM today.  Do you have the ability to pay for your medications:Yes,  [facility]   Release of information consent forms completed and submitted to Medical Records by CSW.  Patient to Follow up at: Follow-up Information   Follow up with [facility] On 04/25/2014. (Appt. with Dr. Ouida Sills at 11:15AM for hospital follow-up/medication management. )    Contact information:   [address] [place]. [place],  [address] Phone: [phone] Fax: [phone]      Patient denies SI/HI:   Yes,  during group/self report.     Safety Planning and Suicide Prevention discussed:  Yes,  SPE completed with pt's fiance. SPI pamphlet provided to pt and he was encouraged to share information with support network, ask questions, and talk about any concerns relating to SPE.  Tarina Volk Smart LCSWA  04/19/2014, 10:11 AM

## 2014-04-19 NOTE — Progress Notes (Signed)
Adult Psychoeducational Group Note  Date:  04/19/2014 Time:  2:48 PM  Group Topic/Focus:  Early Warning Signs:   The focus of this group is to help patients identify signs or symptoms they exhibit before slipping into an unhealthy state or crisis.  Participation Level:  Did Not Attend  Additional Comments:  Pt was in bed at the time of group.  Justun Anaya C Cordai Rodrigue 04/19/2014, 2:48 PM 

## 2014-04-19 NOTE — Progress Notes (Signed)
Assumed care of patient at 10:30 AM from P. Duke RN

## 2014-04-19 NOTE — BHH Suicide Risk Assessment (Signed)
Suicide Risk Assessment  Discharge Assessment     Demographic Factors:  Male  Total Time spent with patient: 45 minutes  Psychiatric Specialty Exam:     Blood pressure 138/89, pulse 82, temperature 97.4 F (36.3 C), temperature source Oral, resp. rate 16, height 5\' 8"  (1.727 m), weight 108.863 kg (240 lb), SpO2 96.00%.Body mass index is 36.5 kg/(m^2).  General Appearance: Fairly Groomed  Engineer, water::  Fair  Speech:  Clear and Coherent  Volume:  Increased  Mood:  Euthymic  Affect:  Appropriate  Thought Process:  Coherent and Goal Directed  Orientation:  Full (Time, Place, and Person)  Thought Content:  plans as he moves on  Suicidal Thoughts:  No  Homicidal Thoughts:  No  Memory:  Immediate;   Fair Recent;   Fair Remote;   Fair  Judgement:  Fair  Insight:  Present  Psychomotor Activity:  Normal  Concentration:  Fair  Recall:  [employer] of Knowledge:NA  Language: Fair  Akathisia:  No  Handed:    AIMS (if indicated):     Assets:  Desire for Improvement Social Support  Sleep:  Number of Hours: 4.75    Musculoskeletal: Strength & Muscle Tone: within normal limits Gait & Station: BKA left leg Patient leans: N/A   Mental Status Per Nursing Assessment::   On Admission:     Current Mental Status by Physician: IN full contact with reality. Mood euthymic, affect appropriate. He is hopeful. There are no active SI plans or intent. He is willing and motivated to pursue outpatient treatment to help with his adjustment to life out of prison with a disability   Loss Factors: Decline in physical health and Legal issues  Historical Factors: NA  Risk Reduction Factors:   Positive social support  Continued Clinical Symptoms:  Depression:   Comorbid alcohol abuse/dependence Insomnia  Cognitive Features That Contribute To Risk:  Closed-mindedness Polarized thinking Thought constriction (tunnel vision)    Suicide Risk:  Minimal: No identifiable suicidal ideation.   Patients presenting with no risk factors but with morbid ruminations; may be classified as minimal risk based on the severity of the depressive symptoms  Discharge Diagnoses:   AXIS I:  Major Depression recurrent, Anxiety Disorder NOS, Cocaine Abuse AXIS II:  No diagnosis AXIS III:   Past Medical History  Diagnosis Date  . 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 Depression    AXIS IV:  other psychosocial or environmental problems AXIS V:  61-70 mild symptoms  Plan Of Care/Follow-up recommendations:  Activity:  as tolerated Diet:  regular Follow up outpatient basis Is patient on multiple antipsychotic therapies at discharge:  No   Has Patient had three or more failed trials of antipsychotic monotherapy by history:  No  Recommended Plan for Multiple Antipsychotic Therapies: NA    Nicholaus Bloom 04/19/2014, 2:33 PM

## 2014-04-19 NOTE — Progress Notes (Deleted)
Assumed Care of patient at 10:30.
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
## 2014-04-19 NOTE — BHH Group Notes (Signed)
[facility] LCSW Aftercare Discharge Planning Group Note   04/19/2014 9:17 AM  Participation Quality:  DID NOT ATTEND-pt in room sleeping.   [place]

## 2014-04-19 NOTE — Progress Notes (Signed)
RN Discharge Note: Patient discharged and transportation provided by [facility]. Denies SI/HI. Consents signed, AVS reviewed, prescription given and belongings returned. No further questions or concerns voiced. Affect bright, mood happy.Marlynn Perking Elena Davia RN MS EdS 04/19/2014  2:55 PM
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
## 2014-04-24 NOTE — Progress Notes (Signed)
Patient Discharge Instructions:  After Visit Summary (AVS):   Faxed to:  04/24/14 Discharge Summary Note:   Faxed to:  04/24/14 Psychiatric Admission Assessment Note:   Faxed to:  04/24/14 Suicide Risk Assessment - Discharge Assessment:   Faxed to:  04/24/14 Faxed/Sent to the Next Level Care provider:  04/24/14 Faxed to Jinny Bl[facility]om -Dr. Ouida Sills @ [phone]  Patsey Berthold, 04/24/2014, 1:57 PM

## 2014-05-27 ENCOUNTER — Emergency Department (HOSPITAL_COMMUNITY): Payer: Medicaid Other

## 2014-05-27 ENCOUNTER — Emergency Department (HOSPITAL_COMMUNITY)
Admission: EM | Admit: 2014-05-27 | Discharge: 2014-05-27 | Disposition: A | Discharge status: Home / Self Care | Payer: Medicaid Other | Attending: Emergency Medicine | Admitting: Emergency Medicine

## 2014-05-27 ENCOUNTER — Encounter (HOSPITAL_COMMUNITY): Payer: Self-pay | Admitting: Emergency Medicine

## 2014-05-27 DIAGNOSIS — K5900 Constipation, unspecified: Secondary | ICD-10-CM | POA: Insufficient documentation

## 2014-05-27 DIAGNOSIS — E876 Hypokalemia: Secondary | ICD-10-CM

## 2014-05-27 DIAGNOSIS — F419 Anxiety disorder, unspecified: Secondary | ICD-10-CM

## 2014-05-27 DIAGNOSIS — F329 Major depressive disorder, single episode, unspecified: Secondary | ICD-10-CM | POA: Insufficient documentation

## 2014-05-27 DIAGNOSIS — F3289 Other specified depressive episodes: Secondary | ICD-10-CM | POA: Insufficient documentation

## 2014-05-27 DIAGNOSIS — F17200 Nicotine dependence, unspecified, uncomplicated: Secondary | ICD-10-CM | POA: Insufficient documentation

## 2014-05-27 DIAGNOSIS — IMO0002 Reserved for concepts with insufficient information to code with codable children: Secondary | ICD-10-CM | POA: Insufficient documentation

## 2014-05-27 DIAGNOSIS — R079 Chest pain, unspecified: Secondary | ICD-10-CM | POA: Insufficient documentation

## 2014-05-27 DIAGNOSIS — F411 Generalized anxiety disorder: Secondary | ICD-10-CM | POA: Insufficient documentation

## 2014-05-27 LAB — CBC
HCT: 49.7 % (ref 39.0–52.0)
Hemoglobin: 16.9 g/dL (ref 13.0–17.0)
MCH: 28.4 pg (ref 26.0–34.0)
MCHC: 34 g/dL (ref 30.0–36.0)
MCV: 83.4 fL (ref 78.0–100.0)
Platelets: 257 10*3/uL (ref 150–400)
RBC: 5.96 MIL/uL — AB (ref 4.22–5.81)
RDW: 13.7 % (ref 11.5–15.5)
WBC: 8.4 10*3/uL (ref 4.0–10.5)

## 2014-05-27 LAB — BASIC METABOLIC PANEL
BUN: 9 mg/dL (ref 6–23)
CALCIUM: 9.6 mg/dL (ref 8.4–10.5)
CO2: 23 meq/L (ref 19–32)
Chloride: 102 mEq/L (ref 96–112)
Creatinine, Ser: 0.7 mg/dL (ref 0.50–1.35)
GFR calc non Af Amer: >90 mL/min (ref >90)
Glucose, Bld: 132 mg/dL — ABNORMAL HIGH (ref 70–99)
Potassium: 3.2 mEq/L — ABNORMAL LOW (ref 3.7–5.3)
SODIUM: 139 meq/L (ref 137–147)

## 2014-05-27 LAB — I-STAT CHEM 8, ED
BUN: 7 mg/dL (ref 6–23)
CALCIUM ION: 1.28 mmol/L — AB (ref 1.12–1.23)
CHLORIDE: 100 meq/L (ref 96–112)
Creatinine, Ser: 0.7 mg/dL (ref 0.50–1.35)
Glucose, Bld: 129 mg/dL — ABNORMAL HIGH (ref 70–99)
HEMATOCRIT: 54 % — AB (ref 39.0–52.0)
HEMOGLOBIN: 18.4 g/dL — AB (ref 13.0–17.0)
Potassium: 3 mEq/L — ABNORMAL LOW (ref 3.7–5.3)
Sodium: 141 mEq/L (ref 137–147)
TCO2: 22 mmol/L (ref 0–100)

## 2014-05-27 LAB — I-STAT TROPONIN, ED: TROPONIN I, POC: 0 ng/mL (ref 0.00–0.08)

## 2014-05-27 LAB — TROPONIN I

## 2014-05-27 MED ORDER — POTASSIUM CHLORIDE CRYS ER 20 MEQ PO TBCR
40.0000 meq | EXTENDED_RELEASE_TABLET | Freq: Once | ORAL | Status: AC
  Administered 2014-05-27: 40 meq via ORAL
  Filled 2014-05-27: qty 2

## 2014-05-27 NOTE — ED Notes (Signed)
Pt arrived to the ED with a complaint of chest pain.  Pt states he has had elevated blood pressure all week.  Pt states he began to have pain in the left chest area with radiation to the left arm.  Pt has a behavioral hx and recently stop taking his cymbalta.

## 2014-05-27 NOTE — Discharge Instructions (Signed)
- Return to the emergency department if you develop any changing/worsening condition, fever, difficulty breathing, changing or worsening chest pain, feeling lightheaded, or any other concerns (please read additional information regarding your condition below) - Try to eat a potassium rich diet (read below)   Chest Pain (Nonspecific) Chest pain has many causes. Your pain could be caused by something serious, such as a heart attack or a blood clot in the lungs. It could also be caused by something less serious, such as a chest bruise or a virus. Follow up with your doctor. More lab tests or other studies may be needed to find the cause of your pain. Most of the time, nonspecific chest pain will improve within 2 to 3 days of rest and mild pain medicine. HOME CARE  For chest bruises, you may put ice on the sore area for 15-20 minutes, 03-04 times a day. Do this only if it makes you feel better.  Put ice in a plastic bag.  Place a towel between the skin and the bag.  Rest for the next 2 to 3 days.  Go back to work if the pain improves.  See your doctor if the pain lasts longer than 1 to 2 weeks.  Only take medicine as told by your doctor.  Quit smoking if you smoke. GET HELP RIGHT AWAY IF:   There is more pain or pain that spreads to the arm, neck, jaw, back, or belly (abdomen).  You have shortness of breath.  You cough more than usual or cough up blood.  You have very bad back or belly pain, feel sick to your stomach (nauseous), or throw up (vomit).  You have very bad weakness.  You pass out (faint).  You have a fever. Any of these problems may be serious and may be an emergency. Do not wait to see if the problems will go away. Get medical help right away. Call your local emergency services 911 in [place] Do not drive yourself to the hospital. MAKE SURE YOU:   Understand these instructions.  Will watch this condition.  Will get help right away if you or your child is not doing  well or gets worse. Document Released: 05/17/2008 Document Revised: 02/21/2012 Document Reviewed: 05/17/2008 [facility] Patient Information 2014 [place], [place].  Hypokalemia Hypokalemia means that the amount of potassium in the blood is lower than normal.Potassium is a chemical, called an electrolyte, that helps regulate the amount of fluid in the body. It also stimulates muscle contraction and helps nerves function properly.Most of the body's potassium is inside of cells, and only a very small amount is in the blood. Because the amount in the blood is so small, minor changes can be life-threatening. CAUSES  Antibiotics.  Diarrhea or vomiting.  Using laxatives too much, which can cause diarrhea.  Chronic kidney disease.  Water pills (diuretics).  Eating disorders (bulimia).  Low magnesium level.  Sweating a lot. SIGNS AND SYMPTOMS  Weakness.  Constipation.  Fatigue.  Muscle cramps.  Mental confusion.  Skipped heartbeats or irregular heartbeat (palpitations).  Tingling or numbness. DIAGNOSIS  Your health care provider can diagnose hypokalemia with blood tests. In addition to checking your potassium level, your health care provider may also check other lab tests. TREATMENT Hypokalemia can be treated with potassium supplements taken by mouth or adjustments in your current medicines. If your potassium level is very low, you may need to get potassium through a vein (IV) and be monitored in the hospital. A diet high in  potassium is also helpful. Foods high in potassium are:  Nuts, such as peanuts and pistachios.  Seeds, such as sunflower seeds and pumpkin seeds.  Peas, lentils, and lima beans.  Whole grain and bran cereals and breads.  Fresh fruit and vegetables, such as apricots, avocado, bananas, cantaloupe, kiwi, oranges, tomatoes, asparagus, and potatoes.  Orange and tomato juices.  Red meats.  Fruit yogurt. HOME CARE INSTRUCTIONS  Take all medicines as  prescribed by your health care provider.  Maintain a healthy diet by including nutritious food, such as fruits, vegetables, nuts, whole grains, and lean meats.  If you are taking a laxative, be sure to follow the directions on the label. SEEK MEDICAL CARE IF:  Your weakness gets worse.  You feel your heart pounding or racing.  You are vomiting or having diarrhea.  You are diabetic and having trouble keeping your blood glucose in the normal range. SEEK IMMEDIATE MEDICAL CARE IF:  You have chest pain, shortness of breath, or dizziness.  You are vomiting or having diarrhea for more than 2 days.  You faint. MAKE SURE YOU:   Understand these instructions.  Will watch your condition.  Will get help right away if you are not doing well or get worse. Document Released: 11/29/2005 Document Revised: 09/19/2013 Document Reviewed: 06/01/2013 [facility] Patient Information 2014 [place].  Potassium Content of Foods Potassium is a mineral found in many foods and drinks. It helps keep fluids and minerals balanced in your body and also affects how steadily your heart beats. The body needs potassium to control blood pressure and to keep the muscles and nervous system healthy. However, certain health conditions and medicine may require you to eat more or less potassium-rich foods and drinks. Your caregiver or dietitian will tell you how much potassium you should have each day. COMMON SERVING SIZES The list below tells you how big or small common portion sizes are:  1 oz.........4 stacked dice.  3 oz........Marland KitchenDeck of cards.  1 tsp.......Marland KitchenTip of little finger.  1 tbsp....Marland KitchenMarland KitchenThumb.  2 tbsp....Marland KitchenMarland KitchenGolf ball.   c..........Marland KitchenHalf of a fist.  1 c...........Marland KitchenA fist. FOODS AND DRINKS HIGH IN POTASSIUM More than 200 mg of potassium per serving. A serving size is  c (120 mL or noted gram weight) unless otherwise stated. While all the items on this list are high in potassium, some items are  higher in potassium than others. Fruits  Apricots (sliced), 83 g.  Apricots (dried halves), 3 oz / 24 g.  Avocado (cubed),  c / 50 g.  Banana (sliced), 75 g.  Cantaloupe (cubed), 80 g.  Dates (pitted), 5 whole / 35 g.  Figs (dried), 4 whole / 32 g.  Guava, c / 55 g.  Honeydew, 1 wedge / 85 g.  Kiwi (sliced), 90 g.  Nectarine, 1 small / 129 g.  Orange, 1 medium / 131 g.  Orange juice.  Pomegranate seeds, 87 g.  Pomegranate juice.  Prunes (pitted), 3 whole / 30 g.  Prune juice, 3 oz / 90 mL.  Seedless raisins, 3 tbsp / 27 g. Vegetables  Artichoke,  of a medium / 64 g.  Asparagus (boiled), 90 g.  Baked beans,  c / 63 g.  Bamboo shoots,  c / 38 g.  Beets (cooked slices), 85 g.  Broccoli (boiled), 78 g.  Brussels sprout (boiled), 78 g.  Butternut squash (baked), 103 g.  Chickpea (cooked), 82 g.  Green peas (cooked), 80 g.  Hubbard squash (baked cubes),  c / 68  g.  Kidney beans (cooked), 5 tbsp / 55 g.  Lima beans (cooked),  c / 43 g.  Navy beans (cooked),  c / 61 g.  Potato (baked), 61 g.  Potato (boiled), 78 g.  Pumpkin (boiled), 123 g.  Refried beans,  c / 79 g.  Spinach (cooked),  c / 45 g.  Split peas (cooked),  c / 65 g.  Sun-dried tomatoes, 2 tbsp / 7 g.  Sweet potato (baked),  c / 50 g.  Tomato (chopped or sliced), 90 g.  Tomato juice.  Tomato paste, 4 tsp / 21 g.  Tomato sauce,  c / 61 g.  Vegetable juice.  White mushrooms (cooked), 78 g.  Yam (cooked or baked),  c / 34 g.  Zucchini squash (boiled), 90 g. Other Foods and Drinks  Almonds (whole),  c / 36 g.  Cashews (oil roasted),  c / 32 g.  Chocolate milk.  Chocolate pudding, 142 g.  Clams (steamed), 1.5 oz / 43 g.  Dark chocolate, 1.5 oz / 42 g.  Fish, 3 oz / 85 g.  King crab (steamed), 3 oz / 85 g.  Lobster (steamed), 4 oz / 113 g.  Milk (skim, 1%, 2%, whole), 1 c / 240 mL.  Milk chocolate, 2.3 oz / 66 g.  Milk shake.  Nonfat  fruit variety yogurt, 123 g.  Peanuts (oil roasted), 1 oz / 28 g.  Peanut butter, 2 tbsp / 32 g.  Pistachio nuts, 1 oz / 28 g.  Pumpkin seeds, 1 oz / 28 g.  Red meat (broiled, cooked, grilled), 3 oz / 85 g.  Scallops (steamed), 3 oz / 85 g.  Shredded wheat cereal (dry), 3 oblong biscuits / 75 g.  Spaghetti sauce,  c / 66 g.  Sunflower seeds (dry roasted), 1 oz / 28 g.  Veggie burger, 1 patty / 70 g. FOODS MODERATE IN POTASSIUM Between 150 mg and 200 mg per serving. A serving is  c (120 mL or noted gram weight) unless otherwise stated. Fruits  Grapefruit,  of the fruit / 123 g.  Grapefruit juice.  Pineapple juice.  Plums (sliced), 83 g.  Tangerine, 1 large / 120 g. Vegetables  Carrots (boiled), 78 g.  Carrots (sliced), 61 g.  Rhubarb (cooked with sugar), 120 g.  R[place]aga (cooked), 120 g.  Sweet corn (cooked), 75 g.  Yellow snap beans (cooked), 63 g. Other Foods and Drinks   Bagel, 1 bagel / 98 g.  Chicken breast (roasted and chopped),  c / 70 g.  Chocolate ice cream / 66 g.  Pita bread, 1 large / 64 g.  Shrimp (steamed), 4 oz / 113 g.  Swiss cheese (diced), 70 g.  Vanilla ice cream, 66 g.  Vanilla pudding, 140 g. FOODS LOW IN POTASSIUM Less than 150 mg per serving. A serving size is  cup (120 mL or noted gram weight) unless otherwise stated. If you eat more than 1 serving of a food low in potassium, the food may be considered a food high in potassium. Fruits  Apple (slices), 55 g.  Apple juice.  Applesauce, 122 g.  Blackberries, 72 g.  Blueberries, 74 g.  Cranberries, 50 g.  Cranberry juice.  Fruit cocktail, 119 g.  Fruit punch.  Grapes, 46 g.  Grape juice.  Mandarin oranges (canned), 126 g.  Peach (slices), 77 g.  Pineapple (chunks), 83 g.  Raspberries, 62 g.  Red cherries (without pits), 78 g.  Strawberries (sliced), 83 g.  Watermelon (diced), 76 g. Vegetables  Alfalfa sprouts, 17 g.  Bell peppers  (sliced), 46 g.  Cabbage (shredded), 35 g.  Cauliflower (boiled), 62 g.  Celery, 51 g.  Collard greens (boiled), 95 g.  Cucumber (sliced), 52 g.  Eggplant (cubed), 41 g.  Green beans (boiled), 63 g.  Lettuce (shredded), 1 c / 36 g.  Onions (sauteed), 44 g.  Radishes (sliced), 58 g.  Spaghetti squash, 51 g. Other Foods and Drinks  [employer], 1 slice / 28 g.  Black tea.  Brown rice (cooked), 98 g.  Butter croissant, 1 medium / 57 g.  Carbonated soda.  Coffee.  Cheddar cheese (diced), 66 g.  Corn flake cereal (dry), 14 g.  Cottage cheese, 118 g.  Cream of rice cereal (cooked), 122 g.  Cream of wheat cereal (cooked), 126 g.  Crisped rice cereal (dry), 14 g.  Egg (boiled, fried, poached, omelet, scrambled), 1 large / 46 61 g.  English muffin, 1 muffin / 57 g.  Frozen ice pop, 1 pop / 55 g.  Graham cracker, 1 large rectangular cracker / 14 g.  Jelly beans, 112 g.  Non-dairy whipped topping.  Oatmeal, 88 g.  Orange sherbet, 74 g.  Puffed rice cereal (dry), 7 g.  Pasta (cooked), 70 g.  Rice cakes, 4 cakes / 36 g.  Sugared doughnut, 4 oz / 116 g.  White bread, 1 slice / 30 g.  White rice (cooked), 79 93 g.  Wild rice (cooked), 82 g.  Yellow cake, 1 slice / 68 g. Document Released: 07/13/2005 Document Revised: 11/15/2012 Document Reviewed: 04/14/2012 [facility] Patient Information 2014 [place].  Generalized Anxiety Disorder Generalized anxiety disorder (GAD) is a mental disorder. It interferes with life functions, including relationships, work, and school. GAD is different from normal anxiety, which everyone experiences at some point in their lives in response to specific life events and activities. Normal anxiety actually helps [place] prepare for and get through these life events and activities. Normal anxiety goes away after the event or activity is over.  GAD causes anxiety that is not necessarily related to specific events or  activities. It also causes excess anxiety in proportion to specific events or activities. The anxiety associated with GAD is also difficult to control. GAD can vary from mild to severe. People with severe GAD can have intense waves of anxiety with physical symptoms (panic attacks).  SYMPTOMS The anxiety and worry associated with GAD are difficult to control. This anxiety and worry are related to many life events and activities and also occur more days than not for 6 months or longer. People with GAD also have three or more of the following symptoms (one or more in children):  Restlessness.   Fatigue.  Difficulty concentrating.   Irritability.  Muscle tension.  Difficulty sleeping or unsatisfying sleep. DIAGNOSIS GAD is diagnosed through an assessment by your caregiver. Your caregiver will ask you questions aboutyour mood,physical symptoms, and events in your life. Your caregiver may ask you about your medical history and use of alcohol or drugs, including prescription medications. Your caregiver may also do a physical exam and blood tests. Certain medical conditions and the use of certain substances can cause symptoms similar to those associated with GAD. Your caregiver may refer you to a mental health specialist for further evaluation. TREATMENT The following therapies are usually used to treat GAD:   Medication Antidepressant medication usually is prescribed for long-term daily control. Antianxiety medications may be added in severe cases,  especially when panic attacks occur.   Talk therapy (psychotherapy) Certain types of talk therapy can be helpful in treating GAD by providing support, education, and guidance. A form of talk therapy called cognitive behavioral therapy can teach you healthy ways to think about and react to daily life events and activities.  Stress managementtechniques These include yoga, meditation, and exercise and can be very helpful when they are practiced  regularly. A mental health specialist can help determine which treatment is best for you. Some people see improvement with one therapy. However, other people require a combination of therapies. Document Released: 03/26/2013 Document Reviewed: 03/26/2013 [facility] Patient Information 2014 [place], [place].

## 2014-05-27 NOTE — ED Notes (Signed)
Pt escorted to discharge window. Pt verbalized understanding discharge instructions. In no acute distress.  

## 2014-05-27 NOTE — ED Provider Notes (Signed)
CSN: [id number]     Arrival date & time 05/27/14  6962 History   None    Chief Complaint  Patient presents with  . Chest Pain   HPI  Isaac Thomas is a 43 y.o. male with a PMH of depression and left leg amputee who presents to the ED for evaluation of chest pain. History was provided by the patient. Patient states that around 2:15 am he developed gradually worsening chest pain on the left side of his chest to his left shoulder. His pain was described as a constant sharp pain worse with movement. He states he took a 324 mg aspirin. His chest pain continued for about an hour until he arrived in the ED. His chest pain has resolved. He denies any similar chest pain in the past. He denies any associated symptoms including nausea, lightheadedness, diaphoresis, or SOB. He states that he "panicked" when he developed the chest pain and was "worried." Has hx of anxiety and takes xanax for this. He also states he was previously on cymbalta for depression but stopped taking this because it "wasn't working." He has been well with no recent fevers, chills, change in appetite/activity, cough, diarrhea, abdominal pain, or vomiting. Has been constipated and took a laxative PTA. He states he has known low potassium due to poor diet. He is a tobacco user. Last cocaine use 2 months ago. No FH of cardiac disease or MI.     Past Medical History  Diagnosis Date  . 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 Depression    Past Surgical History  Procedure Laterality Date  . Skin graft    . Abdominal surgery    . Above knee leg amputation Left   . Foot fusion Right    Family History  Problem Relation Age of Onset  . Hypertension Mother    History  Substance Use Topics  . Smoking status: Current Some Day Smoker    Types: Cigarettes  . Smokeless tobacco: Not on file     Comment: 1- 2 per day  . Alcohol Use: No    Review of Systems  Constitutional: Negative for fever, chills, activity change, appetite change  and fatigue.  HENT: Negative for congestion and sore throat.   Respiratory: Negative for cough and shortness of breath.   Cardiovascular: Positive for chest pain. Negative for leg swelling.  Gastrointestinal: Positive for constipation. Negative for nausea, vomiting, abdominal pain and diarrhea.  Musculoskeletal: Negative for back pain.  Neurological: Negative for dizziness, weakness, light-headedness and headaches.    Allergies  Morphine and related and Pork-derived products  Home Medications   Prior to Admission medications   Medication Sig Start Date End Date Taking? Authorizing Provider  fluticasone (FLONASE) 50 MCG/ACT nasal spray Place 2 sprays into both nostrils daily. For allergies 04/19/14  Yes Encarnacion Slates, NP  gabapentin (NEURONTIN) 600 MG tablet Take 600 mg by mouth at bedtime.   Yes Historical Provider, MD  ibuprofen (ADVIL,MOTRIN) 800 MG tablet Take 1 tablet (800 mg total) by mouth every 8 (eight) hours as needed for fever or mild pain (Mild pain). Please take with food 04/19/14  Yes Encarnacion Slates, NP  omeprazole (PRILOSEC) 40 MG capsule Take 1 capsule (40 mg total) by mouth daily as needed (heartburn). /Acid reflux 04/19/14  Yes Encarnacion Slates, NP  oxyCODONE-acetaminophen (PERCOCET/ROXICET) 5-325 MG per tablet Take 1 tablet by mouth every 4 (four) hours as needed for severe pain. 04/19/14  Yes Herbert [place], NP  traZODone (DESYREL) 50 MG tablet Take 1 tablet (50 mg total) by mouth at bedtime as needed for sleep. 04/19/14  Yes Encarnacion Slates, NP   BP 153/83  Pulse 83  Temp(Src) 98.1 F (36.7 C) (Oral)  Resp 18  SpO2 97%  Filed Vitals:   05/27/14 0600 05/27/14 0729 05/27/14 0745 05/27/14 0845  BP: 113/76 115/76  124/82  Pulse: 75 81 71   Temp:  97.8 F (36.6 C)  97.5 F (36.4 C)  TempSrc:  Oral  Oral  Resp: 22 18 19    SpO2: 96% 96%      Physical Exam  Nursing note and vitals reviewed. Constitutional: He is oriented to person, place, and time. He appears well-developed and  well-nourished. No distress.  HENT:  Head: Normocephalic and atraumatic.  Right Ear: External ear normal.  Left Ear: External ear normal.  Nose: Nose normal.  Mouth/Throat: Oropharynx is clear and moist.  Eyes: Conjunctivae are normal. Right eye exhibits no discharge. Left eye exhibits no discharge.  Neck: Normal range of motion. Neck supple.  Cardiovascular: Normal rate, regular rhythm and normal heart sounds.  Exam reveals no gallop and no friction rub.   No murmur heard. Pulmonary/Chest: Effort normal and breath sounds normal. No respiratory distress. He has no wheezes. He has no rales. He exhibits tenderness.  Mild tenderness to the left axilla.  Abdominal: Soft. He exhibits no distension. There is no tenderness.  Musculoskeletal: Normal range of motion. He exhibits no edema and no tenderness.  Mild discomfort with left shoulder ROM. Patient moving all extremities. Left above knee amputation.  Neurological: He is alert and oriented to person, place, and time.  Skin: Skin is warm and dry. He is not diaphoretic.    ED Course  Procedures (including critical care time) Labs Review Labs Reviewed  CBC - Abnormal; Notable for the following:    RBC 5.96 (*)    All other components within normal limits  BASIC METABOLIC PANEL - Abnormal; Notable for the following:    Potassium 3.2 (*)    Glucose, Bld 132 (*)    All other components within normal limits  I-STAT CHEM 8, ED - Abnormal; Notable for the following:    Potassium 3.0 (*)    Glucose, Bld 129 (*)    Calcium, Ion 1.28 (*)    Hemoglobin 18.4 (*)    HCT 54.0 (*)    All other components within normal limits  I-STAT TROPOININ, ED  I-STAT CHEM 8, ED    Imaging Review Dg Chest 2 View  05/27/2014   CLINICAL DATA:  Chest pain.  EXAM: CHEST  2 VIEW  COMPARISON:  Chest radiograph August 04, 2013  FINDINGS: The heart size and mediastinal contours are within normal limits. Both lungs are clear. The visualized skeletal structures are  nonsuspicious, mild degenerative change of thoracic spine. Multiple EKG lines overlie the patient and may obscure subtle underlying pathology. Surgical clips in the abdomen likely reflect cholecystectomy.  IMPRESSION: No acute cardiopulmonary process.   Electronically Signed   By: Elon Alas   On: 05/27/2014 06:53     EKG Interpretation   Date/Time:  Monday May 27 2014 03:36:07 EDT Ventricular Rate:  78 PR Interval:  151 QRS Duration: 106 QT Interval:  381 QTC Calculation: 434 R Axis:   -12 Text Interpretation:  Sinus rhythm Abnormal R-wave progression, late  transition No old tracing to compare Confirmed by OTTER  MD, OLGA ([id number])  on 05/27/2014 6:55:37 AM  Results for orders placed during the hospital encounter of 05/27/14  CBC      Result Value Ref Range   WBC 8.4  4.0 - 10.5 K/uL   RBC 5.96 (*) 4.22 - 5.81 MIL/uL   Hemoglobin 16.9  13.0 - 17.0 g/dL   HCT 49.7  39.0 - 52.0 %   MCV 83.4  78.0 - 100.0 fL   MCH 28.4  26.0 - 34.0 pg   MCHC 34.0  30.0 - 36.0 g/dL   RDW 13.7  11.5 - 15.5 %   Platelets 257  150 - 400 K/uL  BASIC METABOLIC PANEL      Result Value Ref Range   Sodium 139  137 - 147 mEq/L   Potassium 3.2 (*) 3.7 - 5.3 mEq/L   Chloride 102  96 - 112 mEq/L   CO2 23  19 - 32 mEq/L   Glucose, Bld 132 (*) 70 - 99 mg/dL   BUN 9  6 - 23 mg/dL   Creatinine, Ser 0.70  0.50 - 1.35 mg/dL   Calcium 9.6  8.4 - 10.5 mg/dL   GFR calc non Af Amer >90  >90 mL/min   GFR calc Af Amer >90  >90 mL/min  TROPONIN I      Result Value Ref Range   Troponin I <0.30  <0.30 ng/mL  I-STAT TROPOININ, ED      Result Value Ref Range   Troponin i, poc 0.00  0.00 - 0.08 ng/mL   Comment 3           I-STAT CHEM 8, ED      Result Value Ref Range   Sodium 141  137 - 147 mEq/L   Potassium 3.0 (*) 3.7 - 5.3 mEq/L   Chloride 100  96 - 112 mEq/L   BUN 7  6 - 23 mg/dL   Creatinine, Ser 0.70  0.50 - 1.35 mg/dL   Glucose, Bld 129 (*) 70 - 99 mg/dL   Calcium, Ion 1.28 (*) 1.12 - 1.23  mmol/L   TCO2 22  0 - 100 mmol/L   Hemoglobin 18.4 (*) 13.0 - 17.0 g/dL   HCT 54.0 (*) 39.0 - 52.0 %     MDM   Isaac Thomas is a 43 y.o. male with a PMH of depression and left leg amputee who presents to the ED for evaluation of chest pain. Etiology of chest pain possibly due to musculoskeletal pain vs anxiety. Less likely cardiac. EKG negative for any acute ischemic changes. Troponin negative x 2. Chest x-ray negative for an acute cardiopulmonary process. Patient's pain resolved throughout his ED visit. Vital signs stable. Labs revealed mild hypokalemia (3.2) which was supplemented in the ED. Patient given potassium rich diet instructions. Instructed to follow-up with his PCP. Return precautions, discharge instructions, and follow-up was discussed with the patient before discharge.    Rechecks   8:35 AM = Patient still chest pain free. Feels much better. Ready for discharge.     Discharge Medication List as of 05/27/2014  8:38 AM       Final impressions: 1. Chest pain   2. Anxiety   3. Hypokalemia       Mercy Moore PA-C   This patient was discussed with Dr. Karlton Lemon, PA-C 05/27/14 [phone]

## 2014-05-28 NOTE — ED Provider Notes (Signed)
Medical screening examination/treatment/procedure(s) were performed by non-physician practitioner and as supervising physician I was immediately available for consultation/collaboration.   EKG Interpretation   Date/Time:  Monday May 27 2014 03:36:07 EDT Ventricular Rate:  78 PR Interval:  151 QRS Duration: 106 QT Interval:  381 QTC Calculation: 434 R Axis:   -12 Text Interpretation:  Sinus rhythm Abnormal R-wave progression, late  transition No old tracing to compare Confirmed by Mintie Witherington  MD, Faron Tudisco ([id number])  on 05/27/2014 6:55:37 AM       Kalman Drape, MD 05/28/14 [phone]

## 2014-08-27 ENCOUNTER — Ambulatory Visit: Payer: Medicaid Other | Attending: Internal Medicine | Admitting: Physical Therapy

## 2014-08-27 DIAGNOSIS — M6281 Muscle weakness (generalized): Secondary | ICD-10-CM | POA: Insufficient documentation

## 2014-08-27 DIAGNOSIS — IMO0001 Reserved for inherently not codable concepts without codable children: Secondary | ICD-10-CM | POA: Insufficient documentation

## 2014-08-27 DIAGNOSIS — R5381 Other malaise: Secondary | ICD-10-CM | POA: Diagnosis not present

## 2014-08-27 DIAGNOSIS — R269 Unspecified abnormalities of gait and mobility: Secondary | ICD-10-CM | POA: Insufficient documentation

## 2014-09-03 ENCOUNTER — Ambulatory Visit: Payer: Medicaid Other | Admitting: Physical Therapy

## 2014-11-04 ENCOUNTER — Ambulatory Visit: Payer: Medicaid Other | Admitting: Physical Therapy

## 2014-11-06 ENCOUNTER — Encounter: Payer: Self-pay | Admitting: Physical Therapy

## 2014-11-06 ENCOUNTER — Ambulatory Visit: Payer: Medicaid Other | Attending: Internal Medicine | Admitting: Physical Therapy

## 2014-11-06 DIAGNOSIS — Z96651 Presence of right artificial knee joint: Secondary | ICD-10-CM | POA: Diagnosis not present

## 2014-11-06 DIAGNOSIS — R6889 Other general symptoms and signs: Secondary | ICD-10-CM

## 2014-11-06 DIAGNOSIS — Z89612 Acquired absence of left leg above knee: Secondary | ICD-10-CM | POA: Diagnosis not present

## 2014-11-06 DIAGNOSIS — R269 Unspecified abnormalities of gait and mobility: Secondary | ICD-10-CM | POA: Insufficient documentation

## 2014-11-06 DIAGNOSIS — R531 Weakness: Secondary | ICD-10-CM | POA: Insufficient documentation

## 2014-11-06 NOTE — Therapy (Signed)
Physical Therapy Treatment  Patient Details  Name: Isaac Thomas MRN: [id number] Date of Birth: [birth year]  Encounter Date: 11/06/2014      PT End of Session - 11/06/14 1515    Visit Number 2   Number of Visits 9   Authorization Type Medicaid    Authorization Time Period 10/30/14- 12/24/14   Authorization - Visit Number 1   Authorization - Number of Visits 8   PT Start Time 1107   PT Stop Time 1202   PT Time Calculation (min) 55 min   Equipment Utilized During Treatment Gait belt   Activity Tolerance Patient tolerated treatment well   Behavior During Therapy WFL for tasks assessed/performed      Past Medical History  Diagnosis Date  . 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 Depression     Past Surgical History  Procedure Laterality Date  . Skin graft    . Abdominal surgery    . Above knee leg amputation Left   . Foot fusion Right     There were no vitals taken for this visit.  Visit Diagnosis:  Abnormality of gait  Activity intolerance  Weakness generalized  Status post above knee amputation of left lower extremity  Status post total right knee replacement      Subjective Assessment - 11/06/14 1114    Symptoms He recieved new prosthesis on 10/30/14. He has worn it 3x only for 1/2 hour. He limits wear until someone is with him since he lives alone.   Patient Stated Goals To use new prosthesis to be mobile.   Currently in Pain? No/denies            [facility] Adult PT Treatment/Exercise - 11/06/14 1100    Transfers   Sit to Stand 5: Supervision   Sit to Stand Details (indicate cue type and reason) PT demo /manual cues for prosthesis control  verbal cues prior to activity & during   Stand to Sit 5: Supervision   Stand to Sit Details demo & instruction in prosthesis control  verbal cues prior to activity & during   Ambulation/Gait   Ambulation/Gait Yes   Ambulation/Gait Assistance 4: Min guard   Ambulation Distance (Feet) 95 Feet  2x, UEs fatigue    Assistive device Rolling walker   Gait Pattern Step-through pattern;Decreased stance time - left;Decreased step length - left;Decreased stride length;Decreased weight shift to left;Trunk flexed  manual cues on pelvic rotation to advance prosthesis,   Stairs Yes   Stairs Assistance 4: Min assist   Stairs Assistance Details (indicate cue type and reason) PT demo technique prior to activity   Stair Management Technique Step to pattern;Two rails;Other (comment)  new prosthesis   Number of Stairs 4   Ramp 4: Min assist  RW & prosthesis   Curb 4: Min assist  RW & prosthesis   Exercises   Exercises --  Seated 4 point seqeunce with cards (10reps, 3-5x/day)   Prosthetics   Prosthetic Care Comments  PT demo knee control in gait & sit/stand; proper donning & adding socks   Current prosthetic wear tolerance (days/week)  3 days since delivery 7 days ago   Current prosthetic wear tolerance (#hours/day)  15 minutes at time prior to PT today, 45 minutes during PT  PT instructed wear daily 2hours 2x/day in sitting    Residual limb condition  No open areas or c/o discomfort /pain   Education Provided Skin check;Prosthetic cleaning;Correct ply sock adjustment;Proper wear schedule/adjustment;Other (comment)  proper  donning with new suspension of ring suction   Person(s) Educated Patient   Education Method Explanation;Demonstration;Verbal cues   Education Method Verbalized understanding;Returned demonstration;Needs further instruction   Donning Prosthesis Supervision  cues only   Doffing Prosthesis Modified independent (device/increased time)   Transfers   Sit to Stand Details Visual cues for safe use of DME/AE;Verbal cues for technique;Verbal cues for gait pattern;Verbal cues for safe use of DME/AE;Other (comment)  prosthetic knee control          PT Education - 11/06/14 1514    Education provided Yes   Education Details new prosthesis (see prosthetic section), 4 pt gait pattern seated exercise    Person(s) Educated Patient   Methods Explanation;Demonstration;Other (comment)  numbered cards   Comprehension Verbalized understanding;Returned demonstration;Need further instruction          PT Short Term Goals - 11/06/14 1522    PT SHORT TERM GOAL #1   Title demonstrate understanding of initial HEP for right knee (Target: 4th treatment)   Time 4   Period Weeks   Status On-going   PT SHORT TERM GOAL #2   Title ambulates 200' with rolling walker & prosthesis modified independent.  (Target: 4th treatment)   Time 4   Period Weeks   Status On-going   PT SHORT TERM GOAL #3   Title negotiate ramp & curb with rolling walker & prosthesis with minimal assist.  (Target: 4th treatment)   Time 4   Period Weeks   Status On-going   PT SHORT TERM GOAL #4   Title demonstrates proper donning of new prosthesis & tolerates wear >5 hours/day.  (Target: 4th treatment)   Time 4   Period Weeks   Status On-going          PT Long Term Goals - 11/06/14 1525    PT LONG TERM GOAL #1   Title verbalize understanding of community fitness opportunities.  (Target: 8th treatment)   Time 8   Period Weeks   Status On-going   PT LONG TERM GOAL #2   Title ambulates 400' with LRAD & prosthesis modified independent.  (Target: 8th treatment)   Time 8   Period Weeks   Status On-going   PT LONG TERM GOAL #3   Title negotiate ramp, curb, stairs with LRAD & prosthesis modified independent.  (Target: 8th treatment)   Time 8   Period Weeks   Status On-going   PT LONG TERM GOAL #4   Title reports knee pain increases </= 2 increments with standing / gait activities.  (Target: 8th treatment)   Time 8   Period Weeks   Status On-going   PT LONG TERM GOAL #5   Title verbalizes /demonstrates proper prosthetic care & reports wear of prosthesis >80% of awake hours.  (Target: 8th treatment)   Time 8   Period Weeks   Status On-going          Plan - 11/06/14 1517    Clinical Impression Statement He  seems to understand basics of how new prosthesis works. He was able to ambulate with less assistance & effort with new prosthesis.   Pt will benefit from skilled therapeutic intervention in order to improve on the following deficits Abnormal gait;Decreased activity tolerance;Decreased balance;Decreased endurance;Decreased knowledge of precautions;Decreased knowledge of use of DME;Decreased mobility;Decreased range of motion;Decreased strength;Other (comment)  prosthetic dependency   Rehab Potential Good   Clinical Impairments Affecting Rehab Potential bilateral LE impairments   PT Frequency 1x / week   PT Duration  8 weeks   PT Treatment/Interventions ADLs/Self Care Home Management;DME Instruction;Gait training;Stair training;Functional mobility training;Therapeutic activities;Therapeutic exercise;Balance training;Neuromuscular re-education;Patient/family education;Manual techniques;Other (comment)  prosthetic training   PT Next Visit Plan review prosthetic care, barriers with walker/prosthesis, level surface with crutches   Consulted and Agree with Plan of Care Patient        Problem List Patient Active Problem List   Diagnosis Date Noted  . Major depression 04/17/2014  . Anxiety state, unspecified 04/17/2014  . Cocaine abuse 04/17/2014  . Cellulitis 08/13/2013  . S/P AKA (above knee amputation) 08/13/2013  . GERD (gastroesophageal reflux disease) 07/31/2013  . Depression (emotion) 07/31/2013  . Neuropathy 07/31/2013  . Erectile dysfunction 07/31/2013   Jamey Reas, PT, DPT Physical Therapist Specializing in Prosthetics & Limb Loss Care Phone: [phone] FAX: [phone] [address] [place],  [address]    Jamey Reas 11/06/2014, 3:42 PM

## 2014-11-12 ENCOUNTER — Encounter: Payer: Self-pay | Admitting: Physical Therapy

## 2014-11-12 ENCOUNTER — Ambulatory Visit: Payer: Medicaid Other | Attending: Internal Medicine | Admitting: Physical Therapy

## 2014-11-12 DIAGNOSIS — R531 Weakness: Secondary | ICD-10-CM

## 2014-11-12 DIAGNOSIS — R269 Unspecified abnormalities of gait and mobility: Secondary | ICD-10-CM | POA: Diagnosis not present

## 2014-11-12 DIAGNOSIS — Z96651 Presence of right artificial knee joint: Secondary | ICD-10-CM | POA: Insufficient documentation

## 2014-11-12 DIAGNOSIS — Z89612 Acquired absence of left leg above knee: Secondary | ICD-10-CM | POA: Insufficient documentation

## 2014-11-12 DIAGNOSIS — R6889 Other general symptoms and signs: Secondary | ICD-10-CM | POA: Diagnosis not present

## 2014-11-12 NOTE — Therapy (Signed)
[facility] [address] [place], [place], [address] Phone: [phone]   Fax:  [phone]  Physical Therapy Treatment  Patient Details  Name: Isaac Thomas MRN: [id number] Date of Birth: 1971/08/18  Encounter Date: 11/12/2014      PT End of Session - 11/12/14 1731    Visit Number 3   Number of Visits 9   Authorization Type Medicaid    Authorization Time Period 10/30/14- 12/24/14   Authorization - Visit Number 2   Authorization - Number of Visits 8   PT Start Time 7353   PT Stop Time 1623   PT Time Calculation (min) 53 min   Equipment Utilized During Treatment Gait belt   Activity Tolerance Patient limited by fatigue   Behavior During Therapy [facility] for tasks assessed/performed      Past Medical History  Diagnosis Date  . 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 Depression     Past Surgical History  Procedure Laterality Date  . Skin graft    . Abdominal surgery    . Above knee leg amputation Left   . Foot fusion Right     There were no vitals taken for this visit.  Visit Diagnosis:  Abnormality of gait  Activity intolerance  Weakness generalized  Status post above knee amputation of left lower extremity  Status post total right knee replacement      Subjective Assessment - 11/12/14 1713    Symptoms Reports he has been sick for last 4 days. He went to Urgent Care 2 days ago & was diagnosed with respiratory infection. He wore prosthesis for first 3 days for 2 hours 2x/day but has been in bed with illness since.   Currently in Pain? No/denies            [facility] Adult PT Treatment/Exercise - 11/12/14 1530    Transfers   Sit to Stand 5: Supervision;With upper extremity assist;With armrests;From elevated surface  power wc to walker   Stand to Sit 5: Supervision;With upper extremity assist;To elevated surface;With armrests  walker to power wc   Ambulation/Gait   Ambulation/Gait Yes   Ambulation/Gait  Assistance 4: Min guard;4: Min assist  min gaurd with walker & min assist with crutches   Ambulation/Gait Assistance Details 4 point pattern with forearm crutches   Ambulation Distance (Feet) 125 Feet  1x with walker & 1x with crutches   Assistive device Rolling walker;Lofstrands;Other (Comment)  TFA prosthesis   Gait Pattern Step-through pattern;Decreased stance time - left;Decreased step length - left;Decreased stride length;Decreased weight shift to left;Trunk flexed   Stairs Yes   Stairs Assistance 4: Min assist   Stairs Assistance Details (indicate cue type and reason) 2 rails first time, left rail /right crutch 2nd time   Stair Management Technique One rail Right;Two rails;Step to pattern;Forwards;With crutches   Number of Stairs 4  2x   Ramp 4: Min assist   Ramp Details (indicate cue type and reason) RW & prosthesis   Curb 4: Min assist   Curb Details (indicate cue type and reason) walker & prosthesis   Knee/Hip Exercises: Aerobic   Stationary Bike SciFit stepper seated in power wc 5 minutes Level 2.0 with 4 extremities   Prosthetics   Current prosthetic wear tolerance (days/week)  3 days over last week   Current prosthetic wear tolerance (#hours/day)  2 hours 2xday   Residual limb condition  No open areas or c/o discomfort /pain   Education Provided Skin check;Residual limb  care;Proper wear schedule/adjustment  increase wear to 2-3 hours 2x/day daily   Person(s) Educated Patient   Education Method Explanation   Education Method Verbalized understanding   Donning Prosthesis Modified independent (device/increased time)   Doffing Prosthesis Modified independent (device/increased time)          PT Education - 11/12/14 1726    Education provided Yes   Education Details closed chain knee extension at multiple ROM with foot on wall.   Person(s) Educated Patient   Methods Explanation;Demonstration   Comprehension Verbalized understanding          PT Short Term Goals -  11/12/14 1530    PT SHORT TERM GOAL #1   Title demonstrate understanding of initial HEP for right knee (Target: 4th treatment)   Time 4   Period Weeks   Status On-going   PT SHORT TERM GOAL #2   Title ambulates 200' with rolling walker & prosthesis modified independent.  (Target: 4th treatment)   Time 4   Period Weeks   Status On-going   PT SHORT TERM GOAL #3   Title negotiate ramp & curb with rolling walker & prosthesis with minimal assist.  (Target: 4th treatment)   Time 4   Period Weeks   Status On-going   PT SHORT TERM GOAL #4   Title demonstrates proper donning of new prosthesis & tolerates wear >5 hours/day.  (Target: 4th treatment)   Time 4   Period Weeks   Status On-going          PT Long Term Goals - 11/12/14 1530    PT LONG TERM GOAL #1   Title verbalize understanding of community fitness opportunities.  (Target: 8th treatment)   Time 8   Period Weeks   Status On-going   PT LONG TERM GOAL #2   Title ambulates 400' with LRAD & prosthesis modified independent.  (Target: 8th treatment)   Time 8   Period Weeks   Status On-going   PT LONG TERM GOAL #3   Title negotiate ramp, curb, stairs with LRAD & prosthesis modified independent.  (Target: 8th treatment)   Time 8   Period Weeks   Status On-going   PT LONG TERM GOAL #4   Title reports knee pain increases </= 2 increments with standing / gait activities.  (Target: 8th treatment)   Time 8   Period Weeks   Status On-going   PT LONG TERM GOAL #5   Title verbalizes /demonstrates proper prosthetic care & reports wear of prosthesis >80% of awake hours.  (Target: 8th treatment)   Time 8   Period Weeks   Status On-going          Plan - 11/12/14 1727    Clinical Impression Statement Patient did well for first time on forearm crutches in spite of SOB with respiratory infection. He reported less pressure on hands. Patient needed less assistance on ramp, curb & stairs.   Pt will benefit from skilled therapeutic  intervention in order to improve on the following deficits Abnormal gait;Decreased activity tolerance;Decreased balance;Decreased endurance;Decreased knowledge of precautions;Decreased knowledge of use of DME;Decreased mobility;Decreased range of motion;Decreased strength;Other (comment)  prosthetic dependency                               Problem List Patient Active Problem List   Diagnosis Date Noted  . Major depression 04/17/2014  . Anxiety state, unspecified 04/17/2014  . Cocaine abuse 04/17/2014  .  Cellulitis 08/13/2013  . S/P AKA (above knee amputation) 08/13/2013  . GERD (gastroesophageal reflux disease) 07/31/2013  . Depression (emotion) 07/31/2013  . Neuropathy 07/31/2013  . Erectile dysfunction 07/31/2013    Lamaya Hyneman 11/12/2014, 5:32 PM     Jamey Reas, PT, DPT PT Specializing in [place] 11/12/2014 5:33 PM Phone:  [phone]  Fax:  [phone] [place] [address] [place], [place] [address]

## 2014-11-19 ENCOUNTER — Ambulatory Visit: Payer: Medicaid Other | Admitting: Physical Therapy

## 2014-11-19 DIAGNOSIS — Z96651 Presence of right artificial knee joint: Secondary | ICD-10-CM

## 2014-11-19 DIAGNOSIS — R269 Unspecified abnormalities of gait and mobility: Secondary | ICD-10-CM | POA: Diagnosis not present

## 2014-11-19 DIAGNOSIS — Z89612 Acquired absence of left leg above knee: Secondary | ICD-10-CM

## 2014-11-19 DIAGNOSIS — R531 Weakness: Secondary | ICD-10-CM

## 2014-11-19 DIAGNOSIS — R6889 Other general symptoms and signs: Secondary | ICD-10-CM

## 2014-11-19 NOTE — Therapy (Signed)
[facility] [address] [place], [place], [address] Phone: [phone]   Fax:  [phone]  Physical Therapy Treatment  Patient Details  Name: Isaac Thomas MRN: [id number] Date of Birth: 01/10/1971  Encounter Date: 11/19/2014      PT End of Session - 11/19/14 1555    Visit Number 4   Number of Visits 9   Authorization Type Medicaid    Authorization Time Period 10/30/14- 12/24/14   Authorization - Visit Number 3   Authorization - Number of Visits 8   PT Start Time 5809   PT Stop Time 1410   PT Time Calculation (min) 55 min   Equipment Utilized During Treatment Gait belt   Activity Tolerance Patient tolerated treatment well   Behavior During Therapy [facility] for tasks assessed/performed      Past Medical History  Diagnosis Date  . 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 Depression     Past Surgical History  Procedure Laterality Date  . Skin graft    . Abdominal surgery    . Above knee leg amputation Left   . Foot fusion Right     There were no vitals taken for this visit.  Visit Diagnosis:  Abnormality of gait  Activity intolerance  Weakness generalized  Status post above knee amputation of left lower extremity  Status post total right knee replacement      Subjective Assessment - 11/19/14 1322    Symptoms He reports daily wear for 2 hours /day every day. He walked 3 times with walker.    Currently in Pain? No/denies            [facility] Adult PT Treatment/Exercise - 11/19/14 1315    Transfers   Sit to Stand 4: Min assist   Stand to Sit 4: Min assist   Ambulation/Gait   Ambulation/Gait Yes   Ambulation/Gait Assistance 5: Supervision   Ambulation/Gait Assistance Details cues on prosthesis advancement & step length   Ambulation Distance (Feet) 120 Feet  120' with RW, 65' with crutches   Assistive device Rolling walker;Lofstrands  prosthesis   Gait Pattern Step-through pattern;Decreased step length -  left;Decreased stance time - left   Stairs Yes   Stairs Assistance 4: Min guard;5: Supervision  initial min gaurd, progressed to SBA   Stair Management Technique One rail Right;One rail Left;With crutches  right rail/left crutch, left rail /right crutch   Number of Stairs 4  each way so 2 reps   Ramp 4: Min assist   Ramp Details (indicate cue type and reason) RW / prosthesis with verbal cues on technique   Knee/Hip Exercises: Aerobic   Stationary Bike SciFit stepper seated in power wc 10 minutes Level 2.0 with 4 extremities   Knee/Hip Exercises: Prone   Hamstring Curl 10 reps;5 seconds  using sheet to assist motion & stretch at end range   Manual Therapy   Manual Therapy Other (comment)  contract-relax knee flexion antagonist/ agonist   Prosthetics   Current prosthetic wear tolerance (days/week)  7 days /wk   Current prosthetic wear tolerance (#hours/day)  2 hours 2xday   Education Provided Proper wear schedule/adjustment  increase wear to 3-4 hours 2x/day with goal >90% awake hours   Person(s) Educated Patient   Education Method Explanation   Education Method Verbalized understanding   Donning Prosthesis Modified independent (device/increased time)   Doffing Prosthesis Modified independent (device/increased time)   Ambulation   Ambulation/Gait Assistance Details Tactile cues for initiation;Tactile cues  for weight shifting;Tactile cues for placement;Verbal cues for sequencing          PT Education - 11/19/14 1554    Education provided Yes   Education Details proper step length & equality with relationship to prosthetic knee control, prone knee flexion with sheet to stretch   Person(s) Educated Patient   Methods Explanation;Demonstration   Comprehension Verbalized understanding;Returned demonstration          PT Short Term Goals - 11/19/14 1315    PT SHORT TERM GOAL #1   Title demonstrate understanding of initial HEP for right knee (Target: 4th treatment)   Time 4    Period Weeks   Status On-going   PT SHORT TERM GOAL #2   Title ambulates 200' with rolling walker & prosthesis modified independent.  (Target: 4th treatment)   Time 4   Period Weeks   Status On-going   PT SHORT TERM GOAL #3   Title negotiate ramp & curb with rolling walker & prosthesis with minimal assist.  (Target: 4th treatment)   Time 4   Period Weeks   Status On-going   PT SHORT TERM GOAL #4   Title demonstrates proper donning of new prosthesis & tolerates wear >5 hours/day.  (Target: 4th treatment)   Time 4   Period Weeks   Status On-going          PT Long Term Goals - 11/19/14 1315    PT LONG TERM GOAL #1   Title verbalize understanding of community fitness opportunities.  (Target: 8th treatment)   Time 8   Period Weeks   Status On-going   PT LONG TERM GOAL #2   Title ambulates 400' with LRAD & prosthesis modified independent.  (Target: 8th treatment)   Time 8   Period Weeks   Status On-going   PT LONG TERM GOAL #3   Title negotiate ramp, curb, stairs with LRAD & prosthesis modified independent.  (Target: 8th treatment)   Time 8   Period Weeks   Status On-going   PT LONG TERM GOAL #4   Title reports knee pain increases </= 2 increments with standing / gait activities.  (Target: 8th treatment)   Time 8   Period Weeks   Status On-going   PT LONG TERM GOAL #5   Title verbalizes /demonstrates proper prosthetic care & reports wear of prosthesis >80% of awake hours.  (Target: 8th treatment)   Time 8   Period Weeks   Status On-going          Plan - 11/19/14 1556    Clinical Impression Statement Patient improved gait with forearm crutches with minimal issues with sequencing until distracted. Patient reports less UE pressure with crutches compared to RW.   Pt will benefit from skilled therapeutic intervention in order to improve on the following deficits Abnormal gait;Decreased activity tolerance;Decreased balance;Decreased endurance;Decreased knowledge of  precautions;Decreased knowledge of use of DME;Decreased mobility;Decreased range of motion;Decreased strength;Other (comment)   Rehab Potential Good   Clinical Impairments Affecting Rehab Potential bilateral LE impairments   PT Frequency 1x / week   PT Duration 8 weeks   PT Treatment/Interventions ADLs/Self Care Home Management;DME Instruction;Gait training;Stair training;Functional mobility training;Therapeutic activities;Therapeutic exercise;Balance training;Neuromuscular re-education;Patient/family education;Manual techniques;Other (comment)  prosthetic training   PT Next Visit Plan gait with forearm crutches including barriers. Check STGs.   Consulted and Agree with Plan of Care Patient  Problem List Patient Active Problem List   Diagnosis Date Noted  . Major depression 04/17/2014  . Anxiety state, unspecified 04/17/2014  . Cocaine abuse 04/17/2014  . Cellulitis 08/13/2013  . S/P AKA (above knee amputation) 08/13/2013  . GERD (gastroesophageal reflux disease) 07/31/2013  . Depression (emotion) 07/31/2013  . Neuropathy 07/31/2013  . Erectile dysfunction 07/31/2013    Quang Thorpe 11/19/2014, 4:00 PM     Jamey Reas, PT, DPT PT Specializing in Prosthetics & Orthotics 11/19/2014 4:00 PM Phone:  [phone]  Fax:  [phone] [place] [address] [place], [place] [address]

## 2014-11-26 ENCOUNTER — Ambulatory Visit: Payer: Medicaid Other | Admitting: Physical Therapy

## 2014-11-26 ENCOUNTER — Encounter: Payer: Self-pay | Admitting: Physical Therapy

## 2014-11-26 DIAGNOSIS — R269 Unspecified abnormalities of gait and mobility: Secondary | ICD-10-CM

## 2014-11-26 DIAGNOSIS — Z96651 Presence of right artificial knee joint: Secondary | ICD-10-CM

## 2014-11-26 DIAGNOSIS — R531 Weakness: Secondary | ICD-10-CM

## 2014-11-26 DIAGNOSIS — Z89612 Acquired absence of left leg above knee: Secondary | ICD-10-CM

## 2014-11-26 DIAGNOSIS — R6889 Other general symptoms and signs: Secondary | ICD-10-CM

## 2014-11-26 NOTE — Therapy (Signed)
[facility] [address] [place], [place], [address] Phone: [phone]   Fax:  [phone]  Physical Therapy Treatment  Patient Details  Name: Isaac Thomas MRN: [id number] Date of Birth: 21-Jun-1971  Encounter Date: 11/26/2014      PT End of Session - 11/26/14 1445    Visit Number 5   Number of Visits 9   Authorization Type Medicaid    Authorization Time Period 10/30/14- 12/24/14   Authorization - Visit Number 3   Authorization - Number of Visits 8   PT Start Time 2202   PT Stop Time 1545   PT Time Calculation (min) 60 min   Equipment Utilized During Treatment Gait belt   Activity Tolerance Patient tolerated treatment well   Behavior During Therapy [facility] for tasks assessed/performed      Past Medical History  Diagnosis Date  . 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 Depression     Past Surgical History  Procedure Laterality Date  . Skin graft    . Abdominal surgery    . Above knee leg amputation Left   . Foot fusion Right     There were no vitals taken for this visit.  Visit Diagnosis:  Abnormality of gait  Activity intolerance  Weakness generalized  Status post above knee amputation of left lower extremity  Status post total right knee replacement      Subjective Assessment - 11/26/14 1446    Symptoms Walked with walker daily ~70-100'   Currently in Pain? No/denies            [facility] Adult PT Treatment/Exercise - 11/26/14 1445    Transfers   Sit to Stand 4: Min assist;With upper extremity assist;With armrests;From chair/3-in-1  to crutches   Stand to Sit 4: Min assist;With upper extremity assist;With armrests;To chair/3-in-1  from crutches   Stand to Sit Details verbal cues on technique   Ambulation/Gait   Ambulation/Gait Yes   Ambulation/Gait Assistance 4: Min assist;4: Min guard   Ambulation/Gait Assistance Details cues on sequence, posture, left /prosthesis step length   Ambulation  Distance (Feet) 150 Feet   Assistive device Lofstrands  AKA prosthesis   Gait Pattern Step-through pattern;Decreased step length - left;Decreased stance time - left   Stairs Yes   Stairs Assistance 4: Min guard   Stair Management Technique One rail Right;One rail Left;With crutches  1 rail / 1 crutch   Number of Stairs 8   Ramp 3: Mod assist  2 people for safety   Ramp Details (indicate cue type and reason) forearm crutches & prosthesis   Curb 3: Mod assist  2 people for safety   Curb Details (indicate cue type and reason) demo / verbal cues on seqeunce & wt shift   Knee/Hip Exercises: Aerobic   Stationary Bike SciFit stepper seated in power wc 10 minutes Level 2.0 with 4 extremities   Knee/Hip Exercises: Prone   Hamstring Curl 5 reps  isometric contract-relax to increase ROM   Manual Therapy   Manual Therapy Passive ROM  contract-relax, over pressure at end range   Prosthetics   Current prosthetic wear tolerance (days/week)  4-5 days/wk  PT instructed daily to improve function   Current prosthetic wear tolerance (#hours/day)  2 hours 1xday  PT instructed 2-4 hours 2x/day   Residual limb condition  No open areas or c/o discomfort /pain   Education Provided Proper wear schedule/adjustment          PT Education -  11/26/14 1544    Education provided Yes   Education Details Increase wear,   Person(s) Educated Patient   Methods Explanation;Demonstration   Comprehension Verbalized understanding;Returned demonstration          PT Short Term Goals - 11/26/14 1445    PT SHORT TERM GOAL #1   Title demonstrate understanding of initial HEP for right knee (Target: 4th treatment)   Baseline MET 11/26/14.   Time 4   Period Weeks   Status Achieved   PT SHORT TERM GOAL #2   Title ambulates 200' with rolling walker & prosthesis modified independent.  (Target: 4th treatment)   Baseline 11/26/14: progressing distances, ambulating modified independent with rolling walker &  prosthesis at home.   Time 4   Period Weeks   Status On-going   PT SHORT TERM GOAL #3   Title negotiate ramp & curb with rolling walker & prosthesis with minimal assist.  (Target: 4th treatment)   Baseline MET 11/19/14   Time 4   Period Weeks   Status Achieved   PT SHORT TERM GOAL #4   Title demonstrates proper donning of new prosthesis & tolerates wear >5 hours/day.  (Target: 4th treatment)   Baseline 11/26/14: progressing wearing 4-5 days/wk for 2-3 hours   Time 4   Period Weeks   Status On-going          PT Long Term Goals - 11/26/14 1445    PT LONG TERM GOAL #1   Title verbalize understanding of community fitness opportunities.  (Target: 8th treatment)   Time 8   Period Weeks   Status On-going   PT LONG TERM GOAL #2   Title ambulates 400' with LRAD & prosthesis modified independent.  (Target: 8th treatment)   Time 8   Period Weeks   Status On-going   PT LONG TERM GOAL #3   Title negotiate ramp, curb, stairs with LRAD & prosthesis modified independent.  (Target: 8th treatment)   Time 8   Period Weeks   Status On-going   PT LONG TERM GOAL #4   Title reports knee pain increases </= 2 increments with standing / gait activities.  (Target: 8th treatment)   Time 8   Period Weeks   Status On-going   PT LONG TERM GOAL #5   Title verbalizes /demonstrates proper prosthetic care & reports wear of prosthesis >80% of awake hours.  (Target: 8th treatment)   Time 8   Period Weeks   Status On-going          Plan - 11/26/14 1445    Clinical Impression Statement Patient improved gait with forearm crutches and was able to start barriers.    Pt will benefit from skilled therapeutic intervention in order to improve on the following deficits Abnormal gait;Decreased activity tolerance;Decreased balance;Decreased endurance;Decreased knowledge of precautions;Decreased knowledge of use of DME;Decreased mobility;Decreased range of motion;Decreased strength;Other (comment)   Rehab  Potential Good   Clinical Impairments Affecting Rehab Potential bilateral LE impairments   PT Frequency 1x / week   PT Duration 8 weeks   PT Treatment/Interventions ADLs/Self Care Home Management;DME Instruction;Gait training;Stair training;Functional mobility training;Therapeutic activities;Therapeutic exercise;Balance training;Neuromuscular re-education;Patient/family education;Manual techniques;Other (comment)  prosthetic training   PT Next Visit Plan gait with forearm crutches including barriers.   Consulted and Agree with Plan of Care Patient           Problem List Patient Active Problem List   Diagnosis Date Noted  . Major depression 04/17/2014  . Anxiety state, unspecified 04/17/2014  .  Cocaine abuse 04/17/2014  . Cellulitis 08/13/2013  . S/P AKA (above knee amputation) 08/13/2013  . GERD (gastroesophageal reflux disease) 07/31/2013  . Depression (emotion) 07/31/2013  . Neuropathy 07/31/2013  . Erectile dysfunction 07/31/2013    Lorynn Moeser 11/26/2014, 4:00 PM  Jamey Reas, PT, DPT PT Specializing in [place] 11/26/2014 4:01 PM Phone:  [phone]  Fax:  [phone] [place] [address] [place], [place] [address]

## 2014-12-03 ENCOUNTER — Ambulatory Visit: Payer: Medicaid Other | Admitting: Physical Therapy

## 2014-12-03 DIAGNOSIS — R531 Weakness: Secondary | ICD-10-CM

## 2014-12-03 DIAGNOSIS — Z96651 Presence of right artificial knee joint: Secondary | ICD-10-CM

## 2014-12-03 DIAGNOSIS — R6889 Other general symptoms and signs: Secondary | ICD-10-CM

## 2014-12-03 DIAGNOSIS — Z89612 Acquired absence of left leg above knee: Secondary | ICD-10-CM

## 2014-12-03 DIAGNOSIS — R269 Unspecified abnormalities of gait and mobility: Secondary | ICD-10-CM

## 2014-12-03 NOTE — Therapy (Signed)
[facility] [address] [place], [place], [address] Phone: [phone]   Fax:  [phone]  Patient Details  Name: Isaac Thomas MRN: [id number] Date of Birth: 02/15/1971  Encounter Date: 12/03/2014  Patient arrived to PT session with fever & not feeling well. He reports another respiratory infection. PT canceled appt due to infection disease issues and patient not able to tolerate activities. He reported he came to PT today due to fear of losing Medicaid visits. PT requested date extension.   Jamey Reas 12/03/2014, 2:26 PM  [facility] [address] [place], [place], [address] Phone: [phone]   Fax:  [phone]

## 2014-12-17 ENCOUNTER — Encounter: Payer: Self-pay | Admitting: Physical Therapy

## 2014-12-17 ENCOUNTER — Ambulatory Visit: Payer: Medicaid Other | Attending: Internal Medicine | Admitting: Physical Therapy

## 2014-12-17 DIAGNOSIS — Z96651 Presence of right artificial knee joint: Secondary | ICD-10-CM | POA: Insufficient documentation

## 2014-12-17 DIAGNOSIS — R531 Weakness: Secondary | ICD-10-CM

## 2014-12-17 DIAGNOSIS — R269 Unspecified abnormalities of gait and mobility: Secondary | ICD-10-CM | POA: Diagnosis not present

## 2014-12-17 DIAGNOSIS — Z89612 Acquired absence of left leg above knee: Secondary | ICD-10-CM | POA: Diagnosis not present

## 2014-12-17 DIAGNOSIS — R6889 Other general symptoms and signs: Secondary | ICD-10-CM | POA: Insufficient documentation

## 2014-12-17 NOTE — Therapy (Signed)
[facility] [address] [place], [place], [address] Phone: [phone]   Fax:  [phone]  Physical Therapy Treatment  Patient Details  Name: Isaac Thomas MRN: [id number] Date of Birth: November 05, 1971  Encounter Date: 12/17/2014      PT End of Session - 12/17/14 1530    Visit Number 6   Number of Visits 9   Authorization Type Medicaid    Authorization Time Period 10/30/14- 12/24/14   Authorization - Visit Number 5   Authorization - Number of Visits 8   PT Start Time 1400   PT Stop Time 1445   PT Time Calculation (min) 45 min   Equipment Utilized During Treatment Gait belt   Activity Tolerance Patient tolerated treatment well   Behavior During Therapy [facility] for tasks assessed/performed      Past Medical History  Diagnosis Date  . 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 Depression     Past Surgical History  Procedure Laterality Date  . Skin graft    . Abdominal surgery    . Above knee leg amputation Left   . Foot fusion Right     There were no vitals taken for this visit.  Visit Diagnosis:  Abnormality of gait  Activity intolerance  Weakness generalized  Status post above knee amputation of left lower extremity  Status post total right knee replacement      Subjective Assessment - 12/17/14 1400    Symptoms Went to [place] for holidays without prosthesis. He slept on couch for 10 days so his back hurts. Patient requested to focus on use of walker over crutches.   Currently in Pain? Yes   Pain Score 7    Pain Location Back   Pain Descriptors / Indicators Spasm;Sharp   Pain Onset 1 to 4 weeks ago   Pain Frequency Intermittent   Aggravating Factors  movement   Pain Relieving Factors medication /rest   Effect of Pain on Daily Activities slowly increasing activities.   Multiple Pain Sites No                    [facility] Adult PT Treatment/Exercise - 12/17/14 1400    Transfers   Sit to  Stand With upper extremity assist;With armrests;From chair/3-in-1;5: Supervision  to RW   Stand to Sit With upper extremity assist;With armrests;To chair/3-in-1;5: Supervision  with RW   Ambulation/Gait   Ambulation/Gait Yes   Ambulation/Gait Assistance 5: Supervision   Ambulation/Gait Assistance Details cues on posture & step width   Ambulation Distance (Feet) 60 Feet   Assistive device Rolling walker  AKA prosthesis   Gait Pattern Step-through pattern;Decreased step length - left;Decreased stance time - left   Stairs No   Stairs Assistance --   Stair Management Technique --  1 rail / 1 crutch   Ramp 5: Supervision  RW & AKA prosthesis   Curb 5: Supervision  RW & AKA prosthesis   Dynamic Standing Balance   Dynamic Standing - Balance Support During functional activity;No upper extremity supported  intermittent touch on parallel bars to stabilize, wide base   Dynamic Standing - Level of Assistance 5: Stand by assistance;4: Min assist   Dynamic Standing - Balance Activities Reaching for objects;Head turns;Head nods  red theraband single UE forward reach, upward reach & row   Dynamic Standing - Comments verbal cues /tactile cues on balance reactions & movements   Knee/Hip Exercises: Aerobic   Stationary Bike --  Knee/Hip Exercises: Prone   Hamstring Curl --   Manual Therapy   Manual Therapy --   Prosthetics   Current prosthetic wear tolerance (days/week)  4-5 days/wk  PT instructed daily to improve function   Current prosthetic wear tolerance (#hours/day)  limited wear since returning from [place] due to back pain  PT instructed 2-4 hours 2x/day   Residual limb condition  No open areas or c/o discomfort /pain in limb                PT Education - 12/17/14 1400    Education provided Yes   Education Details balance with buttocks in front of counter & walker in front   Person(s) Educated Patient   Methods Explanation;Demonstration   Comprehension Verbalized  understanding;Returned demonstration          PT Short Term Goals - 11/26/14 1445    PT SHORT TERM GOAL #1   Title demonstrate understanding of initial HEP for right knee (Target: 4th treatment)   Baseline MET 11/26/14.   Time 4   Period Weeks   Status Achieved   PT SHORT TERM GOAL #2   Title ambulates 200' with rolling walker & prosthesis modified independent.  (Target: 4th treatment)   Baseline 11/26/14: progressing distances, ambulating modified independent with rolling walker & prosthesis at home.   Time 4   Period Weeks   Status On-going   PT SHORT TERM GOAL #3   Title negotiate ramp & curb with rolling walker & prosthesis with minimal assist.  (Target: 4th treatment)   Baseline MET 11/19/14   Time 4   Period Weeks   Status Achieved   PT SHORT TERM GOAL #4   Title demonstrates proper donning of new prosthesis & tolerates wear >5 hours/day.  (Target: 4th treatment)   Baseline 11/26/14: progressing wearing 4-5 days/wk for 2-3 hours   Time 4   Period Weeks   Status On-going           PT Long Term Goals - 11/26/14 1445    PT LONG TERM GOAL #1   Title verbalize understanding of community fitness opportunities.  (Target: 8th treatment)   Time 8   Period Weeks   Status On-going   PT LONG TERM GOAL #2   Title ambulates 400' with LRAD & prosthesis modified independent.  (Target: 8th treatment)   Time 8   Period Weeks   Status On-going   PT LONG TERM GOAL #3   Title negotiate ramp, curb, stairs with LRAD & prosthesis modified independent.  (Target: 8th treatment)   Time 8   Period Weeks   Status On-going   PT LONG TERM GOAL #4   Title reports knee pain increases </= 2 increments with standing / gait activities.  (Target: 8th treatment)   Time 8   Period Weeks   Status On-going   PT LONG TERM GOAL #5   Title verbalizes /demonstrates proper prosthetic care & reports wear of prosthesis >80% of awake hours.  (Target: 8th treatment)   Time 8   Period Weeks   Status  On-going               Plan - 12/17/14 1400    Clinical Impression Statement Patient improved safety with rolling walker & prosthesis on ramp & curb. He seems to understand how to safely work on balance on home some.   Pt will benefit from skilled therapeutic intervention in order to improve on the following deficits Abnormal gait;Decreased activity tolerance;Decreased balance;Decreased  endurance;Decreased knowledge of precautions;Decreased knowledge of use of DME;Decreased mobility;Decreased range of motion;Decreased strength;Other (comment)   Rehab Potential Good   Clinical Impairments Affecting Rehab Potential bilateral LE impairments   PT Frequency 1x / week   PT Duration 8 weeks   PT Treatment/Interventions ADLs/Self Care Home Management;DME Instruction;Gait training;Stair training;Functional mobility training;Therapeutic activities;Therapeutic exercise;Balance training;Neuromuscular re-education;Patient/family education;Manual techniques;Other (comment)  prosthetic training   PT Next Visit Plan gait with rolling walker including barriers.   Consulted and Agree with Plan of Care Patient        Problem List Patient Active Problem List   Diagnosis Date Noted  . Major depression 04/17/2014  . Anxiety state, unspecified 04/17/2014  . Cocaine abuse 04/17/2014  . Cellulitis 08/13/2013  . S/P AKA (above knee amputation) 08/13/2013  . GERD (gastroesophageal reflux disease) 07/31/2013  . Depression (emotion) 07/31/2013  . Neuropathy 07/31/2013  . Erectile dysfunction 07/31/2013    Constanza Mincy 12/17/2014, 5:36 PM  [facility] [address] [place], [place], [address] Phone: [phone]   Fax:  [phone]   Jamey Reas, PT, DPT PT Specializing in [place] 12/17/2014 5:37 PM Phone:  [phone]  Fax:  [phone] [facility] [address] [place], [place]  [address]

## 2014-12-24 ENCOUNTER — Encounter: Payer: Self-pay | Admitting: Physical Therapy

## 2014-12-24 ENCOUNTER — Ambulatory Visit: Payer: Medicaid Other | Admitting: Physical Therapy

## 2014-12-24 DIAGNOSIS — Z89612 Acquired absence of left leg above knee: Secondary | ICD-10-CM

## 2014-12-24 DIAGNOSIS — R531 Weakness: Secondary | ICD-10-CM

## 2014-12-24 DIAGNOSIS — R269 Unspecified abnormalities of gait and mobility: Secondary | ICD-10-CM

## 2014-12-24 DIAGNOSIS — R6889 Other general symptoms and signs: Secondary | ICD-10-CM

## 2014-12-24 NOTE — Therapy (Signed)
[facility] [address] [place], [place], [address] Phone: [phone]   Fax:  [phone]  Physical Therapy Treatment  Patient Details  Name: Isaac Thomas MRN: [id number] Date of Birth: 01/08/1971 Referring Provider:  Tresa Garter, MD  Encounter Date: 12/24/2014      PT End of Session - 12/24/14 1400    Visit Number 7   Number of Visits 9   Authorization Type Medicaid    Authorization Time Period 10/30/14- 12/24/14   Authorization - Visit Number 6   Authorization - Number of Visits 8   PT Start Time 1400   PT Stop Time 1500   PT Time Calculation (min) 60 min   Equipment Utilized During Treatment Gait belt   Activity Tolerance Patient tolerated treatment well   Behavior During Therapy [facility] for tasks assessed/performed      Past Medical History  Diagnosis Date  . 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 Depression     Past Surgical History  Procedure Laterality Date  . Skin graft    . Abdominal surgery    . Above knee leg amputation Left   . Foot fusion Right     There were no vitals taken for this visit.  Visit Diagnosis:  Abnormality of gait  Activity intolerance  Weakness generalized  Status post above knee amputation of left lower extremity      Subjective Assessment - 12/24/14 1407    Symptoms Reports wearing prosthesis 3 of 7 days for 30-60 minutes.    Currently in Pain? No/denies           Prosthetics Assessment - 12/24/14 1400    Current prosthetic wear tolerance (#hours/day)  PT instructed to progress to all awake hours                 [facility] Adult PT Treatment/Exercise - 12/24/14 1400    Transfers   Sit to Stand With upper extremity assist;With armrests;From chair/3-in-1;5: Supervision  to RW   Stand to Sit With upper extremity assist;With armrests;To chair/3-in-1;5: Supervision  with RW   Ambulation/Gait   Ambulation/Gait Yes   Ambulation/Gait  Assistance 5: Supervision   Ambulation/Gait Assistance Details verbal cues on posture /RW position - safety   Ambulation Distance (Feet) 50 Feet  50' X 1, 100' X 1   Assistive device Rolling walker;Prosthesis   Gait Pattern Step-through pattern;Decreased step length - right;Decreased stance time - right;Decreased stride length;Trunk flexed;Wide base of support   Ambulation Surface Level;Indoor   Ramp 5: Supervision  RW & prosthesis   Ramp Details (indicate cue type and reason) cues on safety /technique   Curb 5: Supervision  RW & prosthesis   Curb Details (indicate cue type and reason) cues on foot position while moving RW   Gait Comments stairs: 2 rails forward SBA, R rail/L crutch & L crutch/R rail SBA, sidestepping with 2 hands on left rail with SBA  PT cued different techniques for various community situation   Ambulation/Gait Yes   Ambulation/Gait Assistance 5: Supervision   Assistive device Rolling walker  AKA prosthesis   Gait Pattern Step-through pattern;Decreased step length - left;Decreased stance time - left   Stairs No   Stair Management Technique One rail Right;One rail Left;Two rails;Step to pattern;Sideways;Forwards;With crutches  1 rail / 1 crutch   Number of Stairs 4  5 reps /various set-ups   Ramp 5: Supervision  RW & AKA prosthesis   Curb 5: Supervision  RW & AKA prosthesis   Dynamic Standing Balance   Dynamic Standing - Balance Support --   Dynamic Standing - Level of Assistance --   Dynamic Standing - Balance Activities --   Knee/Hip Exercises: Aerobic   Stationary Bike SciFit stepper seated in power wc 15 minutes Level 2.0 with 4 extremities   Prosthetics   Current prosthetic wear tolerance (days/week)  4-5 days/wk  PT instructed daily to improve function   Residual limb condition  No open areas or c/o discomfort /pain in limb                PT Education - 12/24/14 1400    Education provided Yes   Education Details Wearing prosthesis most of  awake hours & improving activity level Aldean Jewett,   Person(s) Educated Patient   Methods Explanation   Comprehension Verbalized understanding          PT Short Term Goals - 11/26/14 1445    PT SHORT TERM GOAL #1   Title demonstrate understanding of initial HEP for right knee (Target: 4th treatment)   Baseline MET 11/26/14.   Time 4   Period Weeks   Status Achieved   PT SHORT TERM GOAL #2   Title ambulates 200' with rolling walker & prosthesis modified independent.  (Target: 4th treatment)   Baseline 11/26/14: progressing distances, ambulating modified independent with rolling walker & prosthesis at home.   Time 4   Period Weeks   Status On-going   PT SHORT TERM GOAL #3   Title negotiate ramp & curb with rolling walker & prosthesis with minimal assist.  (Target: 4th treatment)   Baseline MET 11/19/14   Time 4   Period Weeks   Status Achieved   PT SHORT TERM GOAL #4   Title demonstrates proper donning of new prosthesis & tolerates wear >5 hours/day.  (Target: 4th treatment)   Baseline 11/26/14: progressing wearing 4-5 days/wk for 2-3 hours   Time 4   Period Weeks   Status On-going           PT Long Term Goals - 11/26/14 1445    PT LONG TERM GOAL #1   Title verbalize understanding of community fitness opportunities.  (Target: 8th treatment)   Time 8   Period Weeks   Status On-going   PT LONG TERM GOAL #2   Title ambulates 400' with LRAD & prosthesis modified independent.  (Target: 8th treatment)   Time 8   Period Weeks   Status On-going   PT LONG TERM GOAL #3   Title negotiate ramp, curb, stairs with LRAD & prosthesis modified independent.  (Target: 8th treatment)   Time 8   Period Weeks   Status On-going   PT LONG TERM GOAL #4   Title reports knee pain increases </= 2 increments with standing / gait activities.  (Target: 8th treatment)   Time 8   Period Weeks   Status On-going   PT LONG TERM GOAL #5   Title verbalizes /demonstrates proper prosthetic care &  reports wear of prosthesis >80% of awake hours.  (Target: 8th treatment)   Time 8   Period Weeks   Status On-going               Plan - 12/24/14 1400    Clinical Impression Statement Patient was able to negotiate ramp & curb with prosthesis & walker with less assistance but cues for safety.   Pt will benefit from skilled therapeutic intervention in order to improve on  the following deficits Abnormal gait;Decreased activity tolerance;Decreased balance;Decreased endurance;Decreased knowledge of precautions;Decreased knowledge of use of DME;Decreased mobility;Decreased range of motion;Decreased strength;Other (comment)   Rehab Potential Good   Clinical Impairments Affecting Rehab Potential bilateral LE impairments   PT Frequency 1x / week   PT Duration 8 weeks   PT Treatment/Interventions ADLs/Self Care Home Management;DME Instruction;Gait training;Stair training;Functional mobility training;Therapeutic activities;Therapeutic exercise;Balance training;Neuromuscular re-education;Patient/family education;Manual techniques;Other (comment)  prosthetic training   PT Next Visit Plan gait with rolling walker including barriers.   Consulted and Agree with Plan of Care Patient        Problem List Patient Active Problem List   Diagnosis Date Noted  . Major depression 04/17/2014  . Anxiety state, unspecified 04/17/2014  . Cocaine abuse 04/17/2014  . Cellulitis 08/13/2013  . S/P AKA (above knee amputation) 08/13/2013  . GERD (gastroesophageal reflux disease) 07/31/2013  . Depression (emotion) 07/31/2013  . Neuropathy 07/31/2013  . Erectile dysfunction 07/31/2013    Jamey Reas 12/24/2014, 6:28 PM  [facility] [address] [place], [place], [address] Phone: [phone]   Fax:  [phone]  Jamey Reas, [place], DPT PT Specializing in [place] 12/24/2014 6:29 PM Phone:  [phone]  Fax:  [phone] [place] [address] [place], [place] [address]

## 2014-12-31 ENCOUNTER — Ambulatory Visit: Payer: Medicaid Other | Admitting: Physical Therapy

## 2015-01-07 ENCOUNTER — Encounter: Payer: Self-pay | Admitting: Physical Therapy

## 2015-01-07 ENCOUNTER — Ambulatory Visit: Payer: Medicaid Other | Admitting: Physical Therapy

## 2015-01-07 DIAGNOSIS — R531 Weakness: Secondary | ICD-10-CM

## 2015-01-07 DIAGNOSIS — R6889 Other general symptoms and signs: Secondary | ICD-10-CM

## 2015-01-07 DIAGNOSIS — R269 Unspecified abnormalities of gait and mobility: Secondary | ICD-10-CM

## 2015-01-07 DIAGNOSIS — Z96651 Presence of right artificial knee joint: Secondary | ICD-10-CM

## 2015-01-07 DIAGNOSIS — Z89612 Acquired absence of left leg above knee: Secondary | ICD-10-CM

## 2015-01-07 NOTE — Therapy (Signed)
[facility] [address] [place], [place], [address] Phone: [phone]   Fax:  [phone]  Physical Therapy Treatment  Patient Details  Name: Isaac Thomas MRN: [id number] Date of Birth: 1971/07/12 Referring Provider:  Tresa Garter, MD  Encounter Date: 01/07/2015    Past Medical History  Diagnosis Date  . 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 Depression     Past Surgical History  Procedure Laterality Date  . Skin graft    . Abdominal surgery    . Above knee leg amputation Left   . Foot fusion Right     There were no vitals taken for this visit.  Visit Diagnosis:  Abnormality of gait  Activity intolerance  Weakness generalized  Status post above knee amputation of left lower extremity  Status post total right knee replacement      Subjective Assessment - 01/07/15 1342    Symptoms Hit toe last week at MD office and has not been able to stand or wear shoe since. Has not followed up with MD about it. Toe assessed by primary PT and was decided to cancell today to conserve visits for when pt is able to fully participate. Pt given handout for contrast bath to help with decreasing pain and tenderness.                         Currently in Pain? Yes   Pain Score 6    Pain Location Toe (Comment which one)   Pain Orientation Right   Pain Descriptors / Indicators Tender;Sore;Aching   Pain Type Acute pain   Pain Onset 1 to 4 weeks ago   Aggravating Factors  weight bearing, touch   Pain Relieving Factors elevation, not wearing shoe           PT Short Term Goals - 11/26/14 1445    PT SHORT TERM GOAL #1   Title demonstrate understanding of initial HEP for right knee (Target: 4th treatment)   Baseline MET 11/26/14.   Time 4   Period Weeks   Status Achieved   PT SHORT TERM GOAL #2   Title ambulates 200' with rolling walker & prosthesis modified independent.  (Target: 4th treatment)   Baseline  11/26/14: progressing distances, ambulating modified independent with rolling walker & prosthesis at home.   Time 4   Period Weeks   Status On-going   PT SHORT TERM GOAL #3   Title negotiate ramp & curb with rolling walker & prosthesis with minimal assist.  (Target: 4th treatment)   Baseline MET 11/19/14   Time 4   Period Weeks   Status Achieved   PT SHORT TERM GOAL #4   Title demonstrates proper donning of new prosthesis & tolerates wear >5 hours/day.  (Target: 4th treatment)   Baseline 11/26/14: progressing wearing 4-5 days/wk for 2-3 hours   Time 4   Period Weeks   Status On-going           PT Long Term Goals - 11/26/14 1445    PT LONG TERM GOAL #1   Title verbalize understanding of community fitness opportunities.  (Target: 8th treatment)   Time 8   Period Weeks   Status On-going   PT LONG TERM GOAL #2   Title ambulates 400' with LRAD & prosthesis modified independent.  (Target: 8th treatment)   Time 8   Period Weeks   Status On-going   PT LONG TERM GOAL #  3   Title negotiate ramp, curb, stairs with LRAD & prosthesis modified independent.  (Target: 8th treatment)   Time 8   Period Weeks   Status On-going   PT LONG TERM GOAL #4   Title reports knee pain increases </= 2 increments with standing / gait activities.  (Target: 8th treatment)   Time 8   Period Weeks   Status On-going   PT LONG TERM GOAL #5   Title verbalizes /demonstrates proper prosthetic care & reports wear of prosthesis >80% of awake hours.  (Target: 8th treatment)   Time 8   Period Weeks   Status On-going      Problem List Patient Active Problem List   Diagnosis Date Noted  . Major depression 04/17/2014  . Anxiety state, unspecified 04/17/2014  . Cocaine abuse 04/17/2014  . Cellulitis 08/13/2013  . S/P AKA (above knee amputation) 08/13/2013  . GERD (gastroesophageal reflux disease) 07/31/2013  . Depression (emotion) 07/31/2013  . Neuropathy 07/31/2013  . Erectile dysfunction 07/31/2013     Willow Ora 01/07/2015, 1:52 PM  Willow Ora, PTA, [facility] [address], [place], [place] [address] [phone] 01/07/2015, 1:52 PM

## 2015-01-14 ENCOUNTER — Encounter: Payer: Self-pay | Admitting: Physical Therapy

## 2015-01-14 ENCOUNTER — Ambulatory Visit: Payer: Medicaid Other | Attending: Internal Medicine | Admitting: Physical Therapy

## 2015-01-14 DIAGNOSIS — R6889 Other general symptoms and signs: Secondary | ICD-10-CM | POA: Diagnosis not present

## 2015-01-14 DIAGNOSIS — Z96651 Presence of right artificial knee joint: Secondary | ICD-10-CM | POA: Diagnosis not present

## 2015-01-14 DIAGNOSIS — R269 Unspecified abnormalities of gait and mobility: Secondary | ICD-10-CM | POA: Diagnosis not present

## 2015-01-14 DIAGNOSIS — R531 Weakness: Secondary | ICD-10-CM | POA: Diagnosis not present

## 2015-01-14 DIAGNOSIS — Z89612 Acquired absence of left leg above knee: Secondary | ICD-10-CM | POA: Diagnosis not present

## 2015-01-14 NOTE — Therapy (Signed)
[facility] [address] [place], [place], [address] Phone: [phone]   Fax:  [phone]  Physical Therapy Treatment  Patient Details  Name: Isaac Thomas MRN: [id number] Date of Birth: 11-02-1971 Referring Provider:  Tresa Garter, MD  Encounter Date: 01/14/2015      PT End of Session - 01/14/15 1538    Visit Number 8   Number of Visits 9   Authorization Type Medicaid    Authorization Time Period 10/30/14- 12/24/14   Authorization - Visit Number 7   Authorization - Number of Visits 8   PT Start Time 2595   PT Stop Time 1625   PT Time Calculation (min) 55 min   Equipment Utilized During Treatment Gait belt   Activity Tolerance Patient tolerated treatment well   Behavior During Therapy [facility] for tasks assessed/performed      Past Medical History  Diagnosis Date  . 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 Depression     Past Surgical History  Procedure Laterality Date  . Skin graft    . Abdominal surgery    . Above knee leg amputation Left   . Foot fusion Right     There were no vitals taken for this visit.  Visit Diagnosis:  Abnormality of gait  Activity intolerance  Weakness generalized  Status post above knee amputation of left lower extremity  Status post total right knee replacement      Subjective Assessment - 01/14/15 1537    Symptoms No falls to report. Has been up on leg and walking since last time. Big toe is still sore.          [facility] Adult PT Treatment/Exercise - 01/14/15 1547    Transfers   Sit to Stand With upper extremity assist;With armrests;From chair/3-in-1;5: Supervision   Stand to Sit With upper extremity assist;With armrests;To chair/3-in-1;5: Supervision   Ambulation/Gait   Ambulation/Gait Yes   Ambulation/Gait Assistance 5: Supervision   Ambulation Distance (Feet) 210 Feet  x1   Assistive device Rolling walker;Prosthesis   Gait Pattern Step-through  pattern   Ambulation Surface Level;Indoor   Ramp 5: Supervision  RW/prosthesis   Ramp Details (indicate cue type and reason) cues on sequence/technique/posture/walker position   Curb 5: Supervision  RW/prosthesis   Curb Details (indicate cue type and reason) cues on stance position for balance before advancing walker up or down curb                          Knee/Hip Exercises: Aerobic   Stationary Bike SciFit stepper seated in power wc 10 minutes Level 3.0 with 4 extremities   Prosthetics   Current prosthetic wear tolerance (days/week)  4-5 days/wk  educated to wear daily for best outcome   Current prosthetic wear tolerance (#hours/day)  2 hours at most  instructed to increase to all awake hours   Residual limb condition  intact per patient   Education Provided Proper wear schedule/adjustment;Correct ply sock adjustment;Residual limb care   Person(s) Educated Patient   Education Method Explanation;Demonstration   Education Method Verbalized understanding   Donning Prosthesis Modified independent (device/increased time)   Doffing Prosthesis Modified independent (device/increased time)           PT Short Term Goals - 11/26/14 1445    PT SHORT TERM GOAL #1   Title demonstrate understanding of initial HEP for right knee (Target: 4th treatment)   Baseline MET 11/26/14.  Time 4   Period Weeks   Status Achieved   PT SHORT TERM GOAL #2   Title ambulates 200' with rolling walker & prosthesis modified independent.  (Target: 4th treatment)   Baseline 11/26/14: progressing distances, ambulating modified independent with rolling walker & prosthesis at home.   Time 4   Period Weeks   Status On-going   PT SHORT TERM GOAL #3   Title negotiate ramp & curb with rolling walker & prosthesis with minimal assist.  (Target: 4th treatment)   Baseline MET 11/19/14   Time 4   Period Weeks   Status Achieved   PT SHORT TERM GOAL #4   Title demonstrates proper donning of new prosthesis & tolerates  wear >5 hours/day.  (Target: 4th treatment)   Baseline 11/26/14: progressing wearing 4-5 days/wk for 2-3 hours   Time 4   Period Weeks   Status On-going           PT Long Term Goals - 11/26/14 1445    PT LONG TERM GOAL #1   Title verbalize understanding of community fitness opportunities.  (Target: 8th treatment)   Time 8   Period Weeks   Status On-going   PT LONG TERM GOAL #2   Title ambulates 400' with LRAD & prosthesis modified independent.  (Target: 8th treatment)   Time 8   Period Weeks   Status On-going   PT LONG TERM GOAL #3   Title negotiate ramp, curb, stairs with LRAD & prosthesis modified independent.  (Target: 8th treatment)   Time 8   Period Weeks   Status On-going   PT LONG TERM GOAL #4   Title reports knee pain increases </= 2 increments with standing / gait activities.  (Target: 8th treatment)   Time 8   Period Weeks   Status On-going   PT LONG TERM GOAL #5   Title verbalizes /demonstrates proper prosthetic care & reports wear of prosthesis >80% of awake hours.  (Target: 8th treatment)   Time 8   Period Weeks   Status On-going           Plan - 01/14/15 1538    Clinical Impression Statement Cues for safety on ramp/curb with walker/prosthesis. Increased gait distance today. Tolerated increased resistance on Scifit today as well without issues. Progressing toward goals.   Pt will benefit from skilled therapeutic intervention in order to improve on the following deficits Abnormal gait;Decreased activity tolerance;Decreased balance;Decreased endurance;Decreased knowledge of precautions;Decreased knowledge of use of DME;Decreased mobility;Decreased range of motion;Decreased strength;Other (comment)   Rehab Potential Good   Clinical Impairments Affecting Rehab Potential bilateral LE impairments   PT Frequency 1x / week   PT Duration 8 weeks   PT Treatment/Interventions ADLs/Self Care Home Management;DME Instruction;Gait training;Stair training;Functional  mobility training;Therapeutic activities;Therapeutic exercise;Balance training;Neuromuscular re-education;Patient/family education;Manual techniques;Other (comment)  prosthetic training   PT Next Visit Plan gait with rolling walker including barriers. Assess remaining goals for discharge.   Consulted and Agree with Plan of Care Patient     Problem List Patient Active Problem List   Diagnosis Date Noted  . Major depression 04/17/2014  . Anxiety state, unspecified 04/17/2014  . Cocaine abuse 04/17/2014  . Cellulitis 08/13/2013  . S/P AKA (above knee amputation) 08/13/2013  . GERD (gastroesophageal reflux disease) 07/31/2013  . Depression (emotion) 07/31/2013  . Neuropathy 07/31/2013  . Erectile dysfunction 07/31/2013    Willow Ora 01/14/2015, 4:32 PM  Willow Ora, PTA, [facility] [address] [place], [place] [address]  [phone] 01/14/2015, 4:33 PM

## 2015-01-21 ENCOUNTER — Encounter: Payer: Self-pay | Admitting: Physical Therapy

## 2015-01-21 ENCOUNTER — Ambulatory Visit: Payer: Medicaid Other | Admitting: Physical Therapy

## 2015-01-21 DIAGNOSIS — R269 Unspecified abnormalities of gait and mobility: Secondary | ICD-10-CM | POA: Diagnosis not present

## 2015-01-21 DIAGNOSIS — Z89612 Acquired absence of left leg above knee: Secondary | ICD-10-CM

## 2015-01-21 DIAGNOSIS — R6889 Other general symptoms and signs: Secondary | ICD-10-CM

## 2015-01-21 NOTE — Therapy (Signed)
[facility] [address] [place], [place], [address] Phone: [phone]   Fax:  [phone]  Patient Details  Name: Isaac Thomas MRN: [id number] Date of Birth: March 24, 1971 Referring Provider:  No ref. provider found  Encounter Date: 01/21/2015  PHYSICAL THERAPY DISCHARGE SUMMARY  Visits from Start of Care: 9  Current functional level related to goals / functional outcomes:      PT Long Term Goals - 01/21/15 1400    PT LONG TERM GOAL #1   Title verbalize understanding of community fitness opportunities.  (Target: 8th treatment)   Baseline MET 01/21/15   Time 8   Period Weeks   Status Achieved   PT LONG TERM GOAL #2   Title ambulates 400' with LRAD & prosthesis modified independent.  (Target: 8th treatment)   Baseline NOT MET 01/21/15 due to distance - Patient ambulates 250' with rolling walker & prosthesis modified independent.   Time 8   Period Weeks   Status Partially Met   PT LONG TERM GOAL #3   Title negotiate ramp, curb, stairs with LRAD & prosthesis modified independent.  (Target: 8th treatment)   Baseline MET 01/21/15 with rolling walker on ramp / curb and stairs with one rail with 2 hands modified independent.   Time 8   Period Weeks   Status Achieved   PT LONG TERM GOAL #4   Title reports knee pain increases </= 2 increments with standing / gait activities.  (Target: 8th treatment)   Baseline MET 01/21/15  Knee sitting 2/10, standing 2-3/10, gait 4/10 and ankle sitting 2/10, standing 4/10, gait 6/10 Both returned to baseline within 5 minutes of sitting.   Time 8   Period Weeks   Status Partially Met   PT LONG TERM GOAL #5   Title verbalizes /demonstrates proper prosthetic care & reports wear of prosthesis >80% of awake hours.  (Target: 8th treatment)   Baseline NOT MET Patient reports wearing 4 of 7 days /wk for 2 - 3.5 hours with no issues. He verbalizes proper progression & rationale to increasing.   Time 8    Period Weeks   Status Not Met       Remaining deficits: Patient is limited in gait by distance with rolling walker dependency. He is independent at basic community level but limited by pain in non-amputated leg due to orthopedic issues. He is limited in function by limited wear but understands how to progress.   Education / Equipment: Prosthetic care & HEP Plan: Patient agrees to discharge.  Patient goals were partially met. Patient is being discharged due to meeting the stated rehab goals. and limited Medicaid visits. ?????       Yassen Kinnett PT, DPT 01/21/2015, 8:31 PM  [facility] [address] [place], [place], [address] Phone: [phone]   Fax:  [phone]

## 2015-01-21 NOTE — Therapy (Signed)
[facility] [address] [place], [place], [address] Phone: [phone]   Fax:  [phone]  Physical Therapy Treatment  Patient Details  Name: Isaac Thomas MRN: [id number] Date of Birth: 1971/04/15 Referring Provider:  Tresa Garter, MD  Encounter Date: 01/21/2015      PT End of Session - 01/21/15 1400    Visit Number 9   Number of Visits 9   Authorization Type Medicaid    Authorization Time Period 10/30/14- 12/24/14   Authorization - Visit Number 8   Authorization - Number of Visits 8   PT Start Time 1400   PT Stop Time 1458   PT Time Calculation (min) 58 min   Equipment Utilized During Treatment Gait belt   Activity Tolerance Patient tolerated treatment well   Behavior During Therapy [facility] for tasks assessed/performed      Past Medical History  Diagnosis Date  . 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 Depression     Past Surgical History  Procedure Laterality Date  . Skin graft    . Abdominal surgery    . Above knee leg amputation Left   . Foot fusion Right     There were no vitals taken for this visit.  Visit Diagnosis:  Abnormality of gait  Activity intolerance  Status post above knee amputation of left lower extremity      Subjective Assessment - 01/21/15 1400    Symptoms Reports wore prosthesis 4 of last 7 days for 2-3.5 hours.   Currently in Pain? Yes     Knee / foot pain: see goals for ratings Knee in sitting: PROM 0 extension to 97 flexion;  AROM -2 extension to 91 flexion Prosthetic gait: Patient ambulated 250' with rolling walker & prosthesis modified independent. Patient negotiated ramp & curb with rolling walker modified independent. Patient negotiated 4 steps with left rail with 2 hands then with right rail with 2 hands turned sideways modified independent.  After session (no charge) SciFit stepper level 3 for 12 minutes. PT recommended joining [employer] where this style of  equipment is available.                       PT Education - 01/21/15 1400    Education provided Yes   Education Details Increasing wear of prosthesis to increase function, increase frequency of short, household walks, 1x/day longer (his best), and minimum 1-2 x/week negotiate barriers.   Person(s) Educated Patient   Methods Explanation   Comprehension Verbalized understanding          PT Short Term Goals - 11/26/14 1445    PT SHORT TERM GOAL #1   Title demonstrate understanding of initial HEP for right knee (Target: 4th treatment)   Baseline MET 11/26/14.   Time 4   Period Weeks   Status Achieved   PT SHORT TERM GOAL #2   Title ambulates 200' with rolling walker & prosthesis modified independent.  (Target: 4th treatment)   Baseline 11/26/14: progressing distances, ambulating modified independent with rolling walker & prosthesis at home.   Time 4   Period Weeks   Status On-going   PT SHORT TERM GOAL #3   Title negotiate ramp & curb with rolling walker & prosthesis with minimal assist.  (Target: 4th treatment)   Baseline MET 11/19/14   Time 4   Period Weeks   Status Achieved   PT SHORT TERM GOAL #4   Title demonstrates proper  donning of new prosthesis & tolerates wear >5 hours/day.  (Target: 4th treatment)   Baseline 11/26/14: progressing wearing 4-5 days/wk for 2-3 hours   Time 4   Period Weeks   Status On-going           PT Long Term Goals - 01/21/15 1400    PT LONG TERM GOAL #1   Title verbalize understanding of community fitness opportunities.  (Target: 8th treatment)   Baseline MET 01/21/15   Time 8   Period Weeks   Status Achieved   PT LONG TERM GOAL #2   Title ambulates 400' with LRAD & prosthesis modified independent.  (Target: 8th treatment)   Baseline NOT MET 01/21/15 due to distance - Patient ambulates 250' with rolling walker & prosthesis modified independent.   Time 8   Period Weeks   Status Partially Met   PT LONG TERM GOAL #3    Title negotiate ramp, curb, stairs with LRAD & prosthesis modified independent.  (Target: 8th treatment)   Baseline MET 01/21/15 with rolling walker on ramp / curb and stairs with one rail with 2 hands modified independent.   Time 8   Period Weeks   Status Achieved   PT LONG TERM GOAL #4   Title reports knee pain increases </= 2 increments with standing / gait activities.  (Target: 8th treatment)   Baseline MET 01/21/15  Knee sitting 2/10, standing 2-3/10, gait 4/10 and ankle sitting 2/10, standing 4/10, gait 6/10 Both returned to baseline within 5 minutes of sitting.   Time 8   Period Weeks   Status Partially Met   PT LONG TERM GOAL #5   Title verbalizes /demonstrates proper prosthetic care & reports wear of prosthesis >80% of awake hours.  (Target: 8th treatment)   Baseline NOT MET Patient reports wearing 4 of 7 days /wk for 2 - 3.5 hours with no issues. He verbalizes proper progression & rationale to increasing.   Time 8   Period Weeks   Status Not Met               Plan - 01/21/15 1400    Clinical Impression Statement Patient fully met 2 of 5 and progressed towards remaining LTGs. He reports significant increase in mobility and activities with PT care.    Pt will benefit from skilled therapeutic intervention in order to improve on the following deficits Abnormal gait;Decreased activity tolerance;Decreased balance;Decreased endurance;Decreased knowledge of precautions;Decreased knowledge of use of DME;Decreased mobility;Decreased range of motion;Decreased strength;Other (comment)   Rehab Potential Good   Clinical Impairments Affecting Rehab Potential bilateral LE impairments   PT Frequency 1x / week   PT Duration 8 weeks   PT Treatment/Interventions ADLs/Self Care Home Management;DME Instruction;Gait training;Stair training;Functional mobility training;Therapeutic activities;Therapeutic exercise;Balance training;Neuromuscular re-education;Patient/family education;Manual  techniques;Other (comment)  prosthetic training   PT Next Visit Plan discharge   Consulted and Agree with Plan of Care Patient        Problem List Patient Active Problem List   Diagnosis Date Noted  . Major depression 04/17/2014  . Anxiety state, unspecified 04/17/2014  . Cocaine abuse 04/17/2014  . Cellulitis 08/13/2013  . S/P AKA (above knee amputation) 08/13/2013  . GERD (gastroesophageal reflux disease) 07/31/2013  . Depression (emotion) 07/31/2013  . Neuropathy 07/31/2013  . Erectile dysfunction 07/31/2013    Jamey Reas PT, DPT Physical Therapist Specializing in Prosthetics 01/21/2015, 8:23 PM  [facility] [address] [place], [place], [address] Phone: [phone]   Fax:  [phone]

## 2015-03-20 IMAGING — CR DG SACRUM/COCCYX 2+V
3 series · 3 of 3 positions shown · non-contrast
Comparison: None.

CLINICAL DATA: Post fall with persistent tail bone pain

SACRUM AND COCCYX - 2+ VIEW

[t sacrum ap]
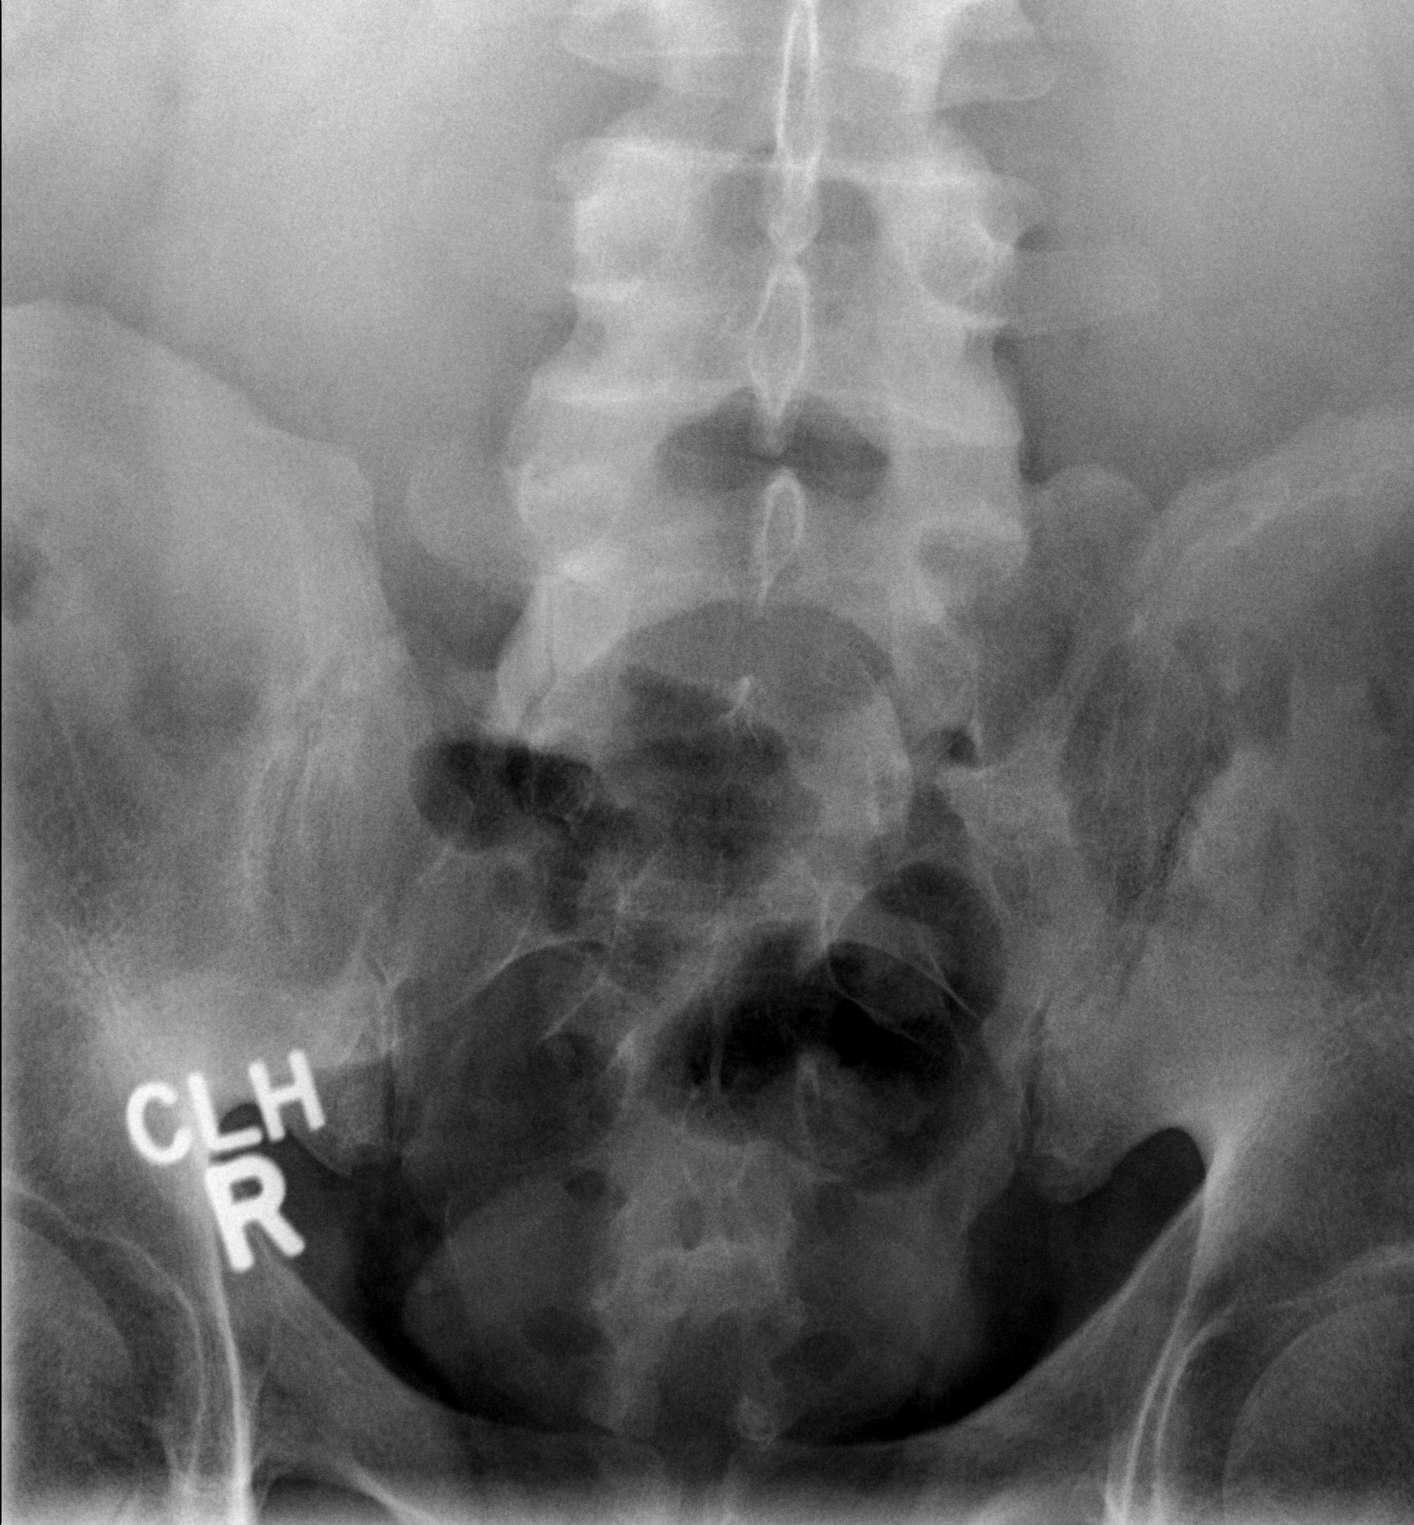

[t coccyx ap]
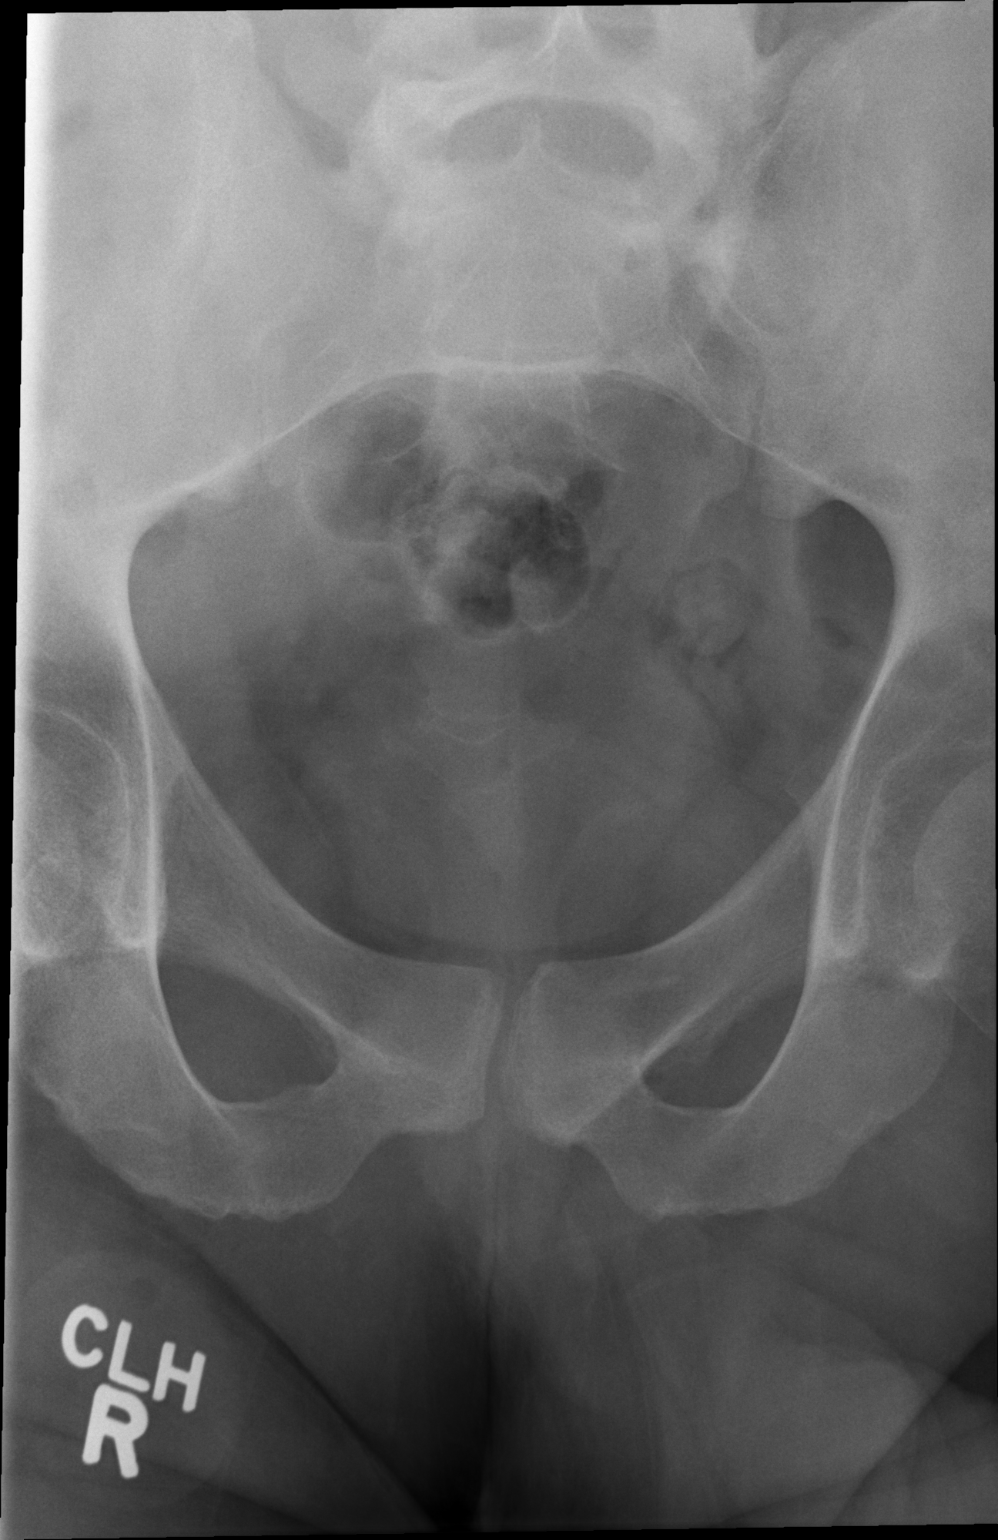

[t sacrum coccyx lat]
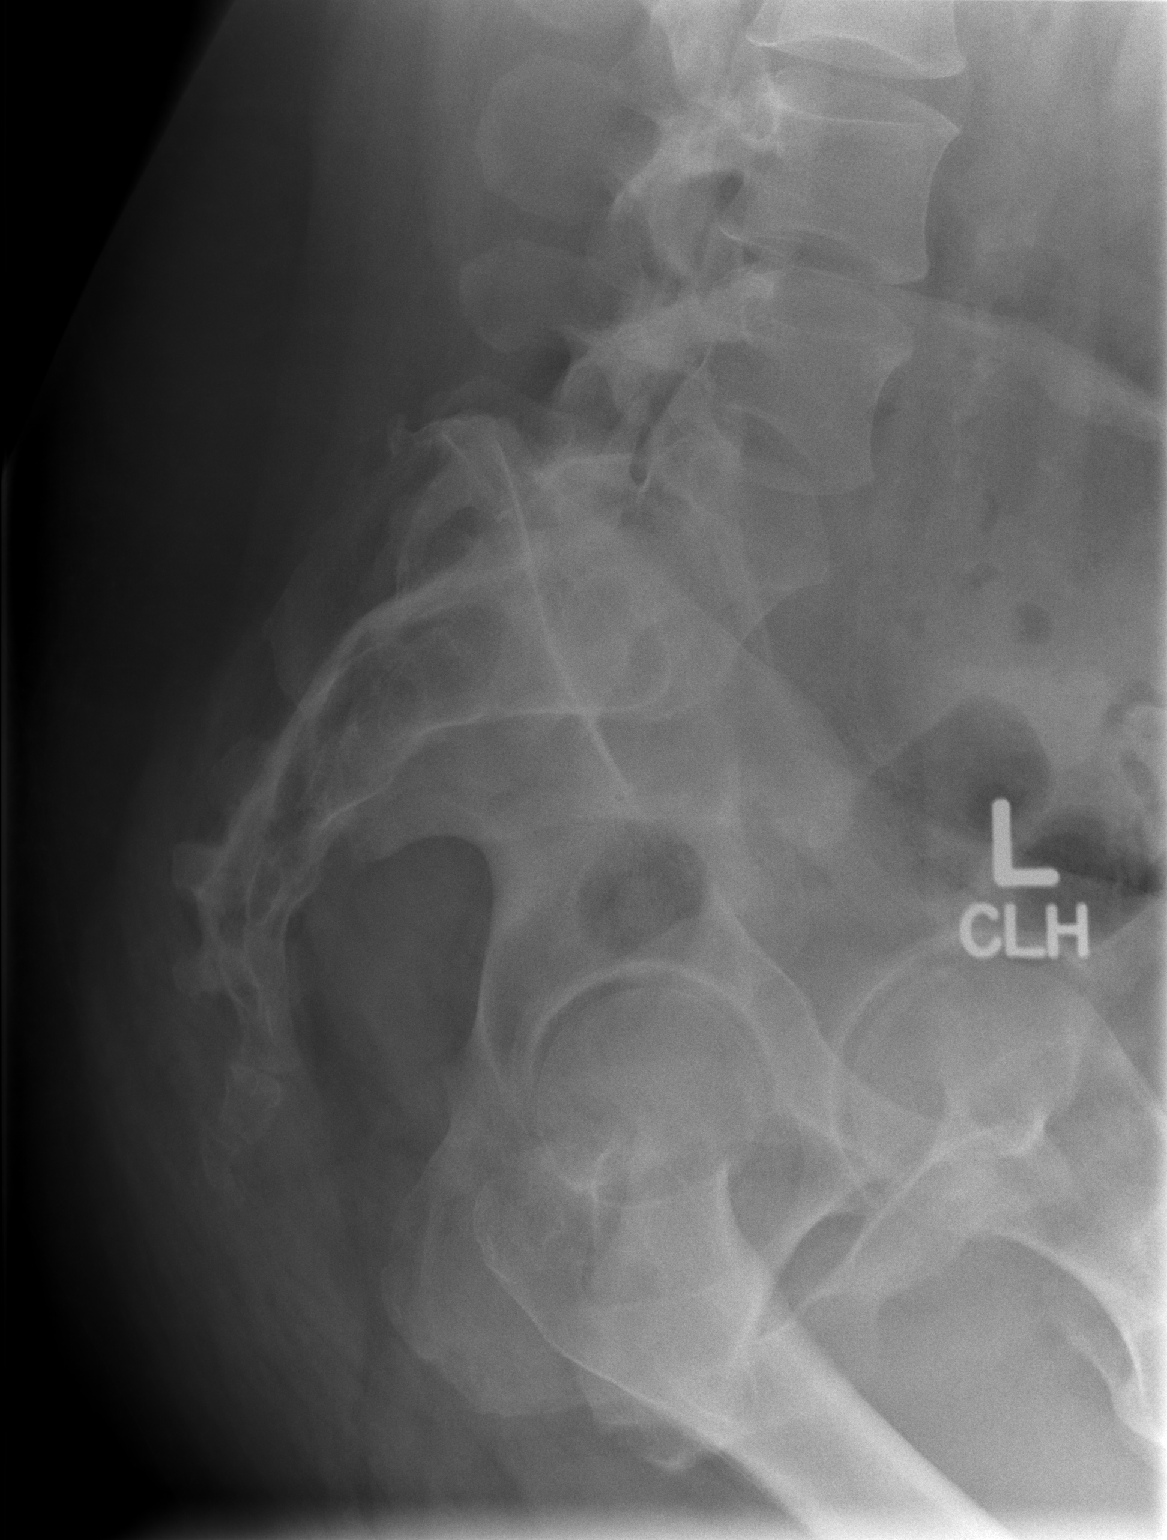

[3 of 3 positions shown; findings below may reference images not displayed]

FINDINGS: There is an ill-defined vertically aligned lucency through the mid
aspect of the coccyx, nonspecific though may represent a
nondisplaced fracture.  Limited visualization of the bilateral SI
joints and pubic symphysis is normal.  Regional soft tissues are
normal.  No radiopaque foreign body.
IMPRESSION: Ill-defined vertically aligned lucency through the mid aspect of
the coccyx, possibly a nutrient foramen, though a nondisplaced
fracture may have a similar appearance.  Correlation for point
tenderness at this location is recommended.

## 2015-07-17 ENCOUNTER — Emergency Department (HOSPITAL_COMMUNITY): Payer: Medicaid Other

## 2015-07-17 ENCOUNTER — Encounter (HOSPITAL_COMMUNITY): Payer: Self-pay | Admitting: Emergency Medicine

## 2015-07-17 ENCOUNTER — Emergency Department (HOSPITAL_COMMUNITY)
Admission: EM | Admit: 2015-07-17 | Discharge: 2015-07-18 | Disposition: A | Discharge status: Home / Self Care | Payer: Medicaid Other | Attending: Emergency Medicine | Admitting: Emergency Medicine

## 2015-07-17 DIAGNOSIS — Z7951 Long term (current) use of inhaled steroids: Secondary | ICD-10-CM | POA: Diagnosis not present

## 2015-07-17 DIAGNOSIS — F329 Major depressive disorder, single episode, unspecified: Secondary | ICD-10-CM | POA: Diagnosis not present

## 2015-07-17 DIAGNOSIS — Y92310 Basketball court as the place of occurrence of the external cause: Secondary | ICD-10-CM | POA: Insufficient documentation

## 2015-07-17 DIAGNOSIS — S62662A Nondisplaced fracture of distal phalanx of right middle finger, initial encounter for closed fracture: Secondary | ICD-10-CM | POA: Insufficient documentation

## 2015-07-17 DIAGNOSIS — Y9367 Activity, basketball: Secondary | ICD-10-CM | POA: Insufficient documentation

## 2015-07-17 DIAGNOSIS — Z79899 Other long term (current) drug therapy: Secondary | ICD-10-CM | POA: Diagnosis not present

## 2015-07-17 DIAGNOSIS — Z720 Tobacco use: Secondary | ICD-10-CM | POA: Insufficient documentation

## 2015-07-17 DIAGNOSIS — S62663D Nondisplaced fracture of distal phalanx of left middle finger, subsequent encounter for fracture with routine healing: Secondary | ICD-10-CM

## 2015-07-17 DIAGNOSIS — Y999 Unspecified external cause status: Secondary | ICD-10-CM | POA: Insufficient documentation

## 2015-07-17 DIAGNOSIS — S6992XA Unspecified injury of left wrist, hand and finger(s), initial encounter: Secondary | ICD-10-CM | POA: Diagnosis present

## 2015-07-17 DIAGNOSIS — W2105XA Struck by basketball, initial encounter: Secondary | ICD-10-CM | POA: Diagnosis not present

## 2015-07-17 MED ORDER — TRAMADOL HCL 50 MG PO TABS: 50.0000 mg | ORAL_TABLET | Freq: Four times a day (QID) | ORAL | Status: AC | PRN

## 2015-07-17 MED ORDER — OXYCODONE-ACETAMINOPHEN 5-325 MG PO TABS
1.0000 | ORAL_TABLET | Freq: Once | ORAL | Status: AC
  Administered 2015-07-17: 1 via ORAL
  Filled 2015-07-17: qty 1

## 2015-07-17 NOTE — ED Notes (Signed)
Called Ortho Tech to apply splint.

## 2015-07-17 NOTE — ED Provider Notes (Signed)
History  This chart was scribed for non-physician practitioner, Alvina Chou, PA-C,working with Daleen Bo, MD, by Marlowe Kays, ED Scribe. This patient was seen in room WA21/WA21 and the patient's care was started at 10:57 PM.  Chief Complaint  Patient presents with  . Finger Injury    left, middle   The history is provided by the patient and medical records. No language interpreter was used.    HPI Comments:  Isaac Thomas is a 44 y.o. male who presents to the Emergency Department complaining of left third finger injury that he sustained approximately two hours ago. He states he was playing wheelchair basketball and is unsure if the ball jammed his finger or if he injured it with the wheelchair. He reports severe pain. He denies modifying factors. He denies numbness, tingling or weakness of the left hand or finger, nausea, vomiting, bruising or wounds.  Past Medical History  Diagnosis Date  . 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 Depression    Past Surgical History  Procedure Laterality Date  . Skin graft    . Abdominal surgery    . Above knee leg amputation Left   . Foot fusion Right    Family History  Problem Relation Age of Onset  . Hypertension Mother    History  Substance Use Topics  . Smoking status: Current Some Day Smoker    Types: Cigarettes  . Smokeless tobacco: Not on file     Comment: 1- 2 per day  . Alcohol Use: No    Review of Systems  Musculoskeletal: Positive for arthralgias.  All other systems reviewed and are negative.   Allergies  Morphine and related and Pork-derived products  Home Medications   Prior to Admission medications   Medication Sig Start Date End Date Taking? Authorizing Provider  fluticasone (FLONASE) 50 MCG/ACT nasal spray Place 2 sprays into both nostrils daily. For allergies 04/19/14  Yes Encarnacion Slates, NP  loratadine (CLARITIN) 10 MG tablet Take 10 mg by mouth daily.   Yes Historical Provider, MD  omeprazole  (PRILOSEC) 40 MG capsule Take 1 capsule (40 mg total) by mouth daily as needed (heartburn). /Acid reflux 04/19/14  Yes Encarnacion Slates, NP  traZODone (DESYREL) 50 MG tablet Take 1 tablet (50 mg total) by mouth at bedtime as needed for sleep. 04/19/14  Yes Encarnacion Slates, NP  ibuprofen (ADVIL,MOTRIN) 800 MG tablet Take 1 tablet (800 mg total) by mouth every 8 (eight) hours as needed for fever or mild pain (Mild pain). Please take with food Patient not taking: Reported on 07/17/2015 04/19/14   Encarnacion Slates, NP  oxyCODONE-acetaminophen (PERCOCET/ROXICET) 5-325 MG per tablet Take 1 tablet by mouth every 4 (four) hours as needed for severe pain. Patient not taking: Reported on 07/17/2015 04/19/14   Encarnacion Slates, NP   Triage Vitals: Pulse 90  Temp(Src) 98.1 F (36.7 C) (Oral)  Resp 16  SpO2 99% Physical Exam  Constitutional: He is oriented to person, place, and time. He appears well-developed and well-nourished. No distress.  HENT:  Head: Normocephalic and atraumatic.  Eyes: Conjunctivae are normal.  Normal appearance  Neck: Normal range of motion.  Cardiovascular: Normal rate and regular rhythm.  Exam reveals no gallop and no friction rub.   No murmur heard. Pulmonary/Chest: Effort normal and breath sounds normal. He has no wheezes. He has no rales. He exhibits no tenderness.  Abdominal: Soft. There is no tenderness.  Musculoskeletal: Normal range of motion.  Generalized left middle finger tenderness to palpation. No obvious deformity. Pt is holding distal phalanx in flexed position. Full ROM of left middle finger.   Neurological: He is alert and oriented to person, place, and time.  Speech is goal-oriented. Moves limbs without ataxia.   Skin: Skin is warm and dry.  Psychiatric: He has a normal mood and affect. His behavior is normal.  Nursing note and vitals reviewed.   ED Course  Procedures (including critical care time) DIAGNOSTIC STUDIES: Oxygen Saturation is 99% on RA, normal by my  interpretation.   COORDINATION OF CARE: 11:01 PM- Will splint finger and prescribe pain medication. Pt verbalizes understanding and agrees to plan.  Medications  oxyCODONE-acetaminophen (PERCOCET/ROXICET) 5-325 MG per tablet 1 tablet (1 tablet Oral Given 07/17/15 1660)   SPLINT APPLICATION Date/Time: 63:01 PM Authorized by: Alvina Chou Consent: Verbal consent obtained. Risks and benefits: risks, benefits and alternatives were discussed Consent given by: patient Splint applied by: orthopedic technician Location details: left middle finger Splint type: static finger Supplies used: static finger splint Post-procedure: The splinted body part was neurovascularly unchanged following the procedure. Patient tolerance: Patient tolerated the procedure well with no immediate complications.     Labs Review Labs Reviewed - No data to display  Imaging Review Dg Finger Middle Left  07/17/2015   CLINICAL DATA:  Jammed left middle finger while playing basketball, with distal left middle finger pain. Initial encounter.  EXAM: LEFT MIDDLE FINGER 2+V  COMPARISON:  None.  FINDINGS: There appears to be a small essentially nondisplaced fracture involving the dorsal aspect of the base of the third distal phalanx. Mild overlying soft tissue swelling is noted. No additional fractures are seen. Slight lucency at the distal tip of the third distal phalanx is thought to be artifactual in nature.  The fracture does extend to the edge of the distal interphalangeal joint space. Remaining visualized joint spaces are grossly preserved.  IMPRESSION: Small nondisplaced fracture involving the dorsal aspect of the base of the third distal phalanx.   Electronically Signed   By: Garald Balding M.D.   On: 07/17/2015 22:33     EKG Interpretation None      MDM   Final diagnoses:  Closed nondisplaced fracture of distal phalanx of left middle finger with routine healing    11:32 PM Patient has a nondisplaced  fracture of the distal phalanx of the middle finger on the left. Patient will have static finger splint and hand surgery follow up.   I personally performed the services described in this documentation, which was scribed in my presence. The recorded information has been reviewed and is accurate.    Alvina Chou, PA-C 07/17/15 2334  Daleen Bo, MD 07/17/15 [phone]

## 2015-07-17 NOTE — ED Notes (Signed)
Pt was playing wheelchair basketball and now tip of middle finger is flexed without ability to extend. Appears deformed. No previous injury/trauma to hand. No other c/c.

## 2015-07-17 NOTE — Progress Notes (Signed)
Orthopedic Tech Progress Note Patient Details:  Isaac Thomas Aug 09, 1971 [id number]  Ortho Devices Type of Ortho Device: Finger splint Ortho Device/Splint Interventions: Application   Cammer, Theodoro Parma 07/17/2015, 11:33 PM

## 2015-07-17 NOTE — Discharge Instructions (Signed)
Keep splint intact until follow up with the hand surgeon. Take Tramadol as needed for pain. Refer to attached documents for more information.

## 2015-07-18 MED ORDER — OXYCODONE-ACETAMINOPHEN 5-325 MG PO TABS
2.0000 | ORAL_TABLET | ORAL | Status: DC | PRN
Start: 2015-07-18 — End: 2016-12-17

## 2015-07-18 NOTE — ED Notes (Signed)
Patient requested to have something a little stronger for pain. PA notified.

## 2015-12-03 ENCOUNTER — Encounter (HOSPITAL_COMMUNITY): Payer: Self-pay | Admitting: Emergency Medicine

## 2015-12-03 ENCOUNTER — Emergency Department (HOSPITAL_COMMUNITY): Payer: Medicaid Other

## 2015-12-03 ENCOUNTER — Emergency Department (HOSPITAL_COMMUNITY)
Admission: EM | Admit: 2015-12-03 | Discharge: 2015-12-03 | Disposition: A | Discharge status: Home / Self Care | Payer: Medicaid Other | Attending: Emergency Medicine | Admitting: Emergency Medicine

## 2015-12-03 DIAGNOSIS — F329 Major depressive disorder, single episode, unspecified: Secondary | ICD-10-CM | POA: Insufficient documentation

## 2015-12-03 DIAGNOSIS — Z9889 Other specified postprocedural states: Secondary | ICD-10-CM | POA: Insufficient documentation

## 2015-12-03 DIAGNOSIS — R2241 Localized swelling, mass and lump, right lower limb: Secondary | ICD-10-CM | POA: Diagnosis not present

## 2015-12-03 DIAGNOSIS — Z79899 Other long term (current) drug therapy: Secondary | ICD-10-CM | POA: Insufficient documentation

## 2015-12-03 DIAGNOSIS — Z89612 Acquired absence of left leg above knee: Secondary | ICD-10-CM | POA: Diagnosis not present

## 2015-12-03 DIAGNOSIS — Z87828 Personal history of other (healed) physical injury and trauma: Secondary | ICD-10-CM | POA: Diagnosis not present

## 2015-12-03 DIAGNOSIS — F17210 Nicotine dependence, cigarettes, uncomplicated: Secondary | ICD-10-CM | POA: Diagnosis not present

## 2015-12-03 DIAGNOSIS — Z7951 Long term (current) use of inhaled steroids: Secondary | ICD-10-CM | POA: Insufficient documentation

## 2015-12-03 DIAGNOSIS — M79661 Pain in right lower leg: Secondary | ICD-10-CM | POA: Diagnosis present

## 2015-12-03 NOTE — Discharge Instructions (Signed)
Pleas make a follow up appointment with Dr. Marla Roe to evaluate your leg. This is likely an old hematoma. If you develop symptoms of infection such as heat, redness, pain, streaking, pus. Also, if you develop fevers, chills. Hematoma A hematoma is a collection of blood under the skin, in an organ, in a body space, in a joint space, or in other tissue. The blood can clot to form a lump that you can see and feel. The lump is often firm and may sometimes become sore and tender. Most hematomas get better in a few days to weeks. However, some hematomas may be serious and require medical care. Hematomas can range in size from very small to very large. CAUSES  A hematoma can be caused by a blunt or penetrating injury. It can also be caused by spontaneous leakage from a blood vessel under the skin. Spontaneous leakage from a blood vessel is more likely to occur in older people, especially those taking blood thinners. Sometimes, a hematoma can develop after certain medical procedures. SIGNS AND SYMPTOMS   A firm lump on the body.  Possible pain and tenderness in the area.  Bruising.Blue, dark blue, purple-red, or yellowish skin may appear at the site of the hematoma if the hematoma is close to the surface of the skin. For hematomas in deeper tissues or body spaces, the signs and symptoms may be subtle. For example, an intra-abdominal hematoma may cause abdominal pain, weakness, fainting, and shortness of breath. An intracranial hematoma may cause a headache or symptoms such as weakness, trouble speaking, or a change in consciousness. DIAGNOSIS  A hematoma can usually be diagnosed based on your medical history and a physical exam. Imaging tests may be needed if your health care provider suspects a hematoma in deeper tissues or body spaces, such as the abdomen, head, or chest. These tests may include ultrasonography or a CT scan.  TREATMENT  Hematomas usually go away on their own over time. Rarely does the  blood need to be drained out of the body. Large hematomas or those that may affect vital organs will sometimes need surgical drainage or monitoring. HOME CARE INSTRUCTIONS   Apply ice to the injured area:   Put ice in a plastic bag.   Place a towel between your skin and the bag.   Leave the ice on for 20 minutes, 2-3 times a day for the first 1 to 2 days.   After the first 2 days, switch to using warm compresses on the hematoma.   Elevate the injured area to help decrease pain and swelling. Wrapping the area with an elastic bandage may also be helpful. Compression helps to reduce swelling and promotes shrinking of the hematoma. Make sure the bandage is not wrapped too tight.   If your hematoma is on a lower extremity and is painful, crutches may be helpful for a couple days.   Only take over-the-counter or prescription medicines as directed by your health care provider. SEEK IMMEDIATE MEDICAL CARE IF:   You have increasing pain, or your pain is not controlled with medicine.   You have a fever.   You have worsening swelling or discoloration.   Your skin over the hematoma breaks or starts bleeding.   Your hematoma is in your chest or abdomen and you have weakness, shortness of breath, or a change in consciousness.  Your hematoma is on your scalp (caused by a fall or injury) and you have a worsening headache or a change in alertness or  consciousness. MAKE SURE YOU:   Understand these instructions.  Will watch your condition.  Will get help right away if you are not doing well or get worse.   This information is not intended to replace advice given to you by your health care provider. Make sure you discuss any questions you have with your health care provider.   Document Released: 07/13/2004 Document Revised: 08/01/2013 Document Reviewed: 05/09/2013 Elsevier Interactive Patient Education [employer].

## 2015-12-03 NOTE — ED Notes (Signed)
Patient presents for right leg pain, reports skin graft in 2001 and over the last 2-3 days has noticed a knot forming on right side of leg. Warm to touch, patient sensation is at baseline, denies fever or chills at home.
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
## 2015-12-08 ENCOUNTER — Emergency Department (HOSPITAL_COMMUNITY)
Admission: EM | Admit: 2015-12-08 | Discharge: 2015-12-09 | Disposition: A | Discharge status: Home / Self Care | Payer: Medicaid Other | Attending: Emergency Medicine | Admitting: Emergency Medicine

## 2015-12-08 ENCOUNTER — Encounter (HOSPITAL_COMMUNITY): Payer: Self-pay | Admitting: Emergency Medicine

## 2015-12-08 ENCOUNTER — Emergency Department (HOSPITAL_COMMUNITY): Payer: Medicaid Other

## 2015-12-08 DIAGNOSIS — F329 Major depressive disorder, single episode, unspecified: Secondary | ICD-10-CM | POA: Diagnosis not present

## 2015-12-08 DIAGNOSIS — Z7951 Long term (current) use of inhaled steroids: Secondary | ICD-10-CM | POA: Insufficient documentation

## 2015-12-08 DIAGNOSIS — Z79899 Other long term (current) drug therapy: Secondary | ICD-10-CM | POA: Diagnosis not present

## 2015-12-08 DIAGNOSIS — Z89612 Acquired absence of left leg above knee: Secondary | ICD-10-CM | POA: Insufficient documentation

## 2015-12-08 DIAGNOSIS — F419 Anxiety disorder, unspecified: Secondary | ICD-10-CM | POA: Insufficient documentation

## 2015-12-08 DIAGNOSIS — R0789 Other chest pain: Secondary | ICD-10-CM

## 2015-12-08 DIAGNOSIS — K219 Gastro-esophageal reflux disease without esophagitis: Secondary | ICD-10-CM | POA: Diagnosis not present

## 2015-12-08 DIAGNOSIS — Z87891 Personal history of nicotine dependence: Secondary | ICD-10-CM | POA: Diagnosis not present

## 2015-12-08 DIAGNOSIS — R079 Chest pain, unspecified: Secondary | ICD-10-CM | POA: Diagnosis present

## 2015-12-08 LAB — BASIC METABOLIC PANEL
ANION GAP: 8 (ref 5–15)
BUN: 12 mg/dL (ref 6–20)
CHLORIDE: 108 mmol/L (ref 101–111)
CO2: 23 mmol/L (ref 22–32)
CREATININE: 0.92 mg/dL (ref 0.61–1.24)
Calcium: 9.5 mg/dL (ref 8.9–10.3)
GFR calc non Af Amer: >60 mL/min (ref >60)
Glucose, Bld: 115 mg/dL — ABNORMAL HIGH (ref 65–99)
POTASSIUM: 3.7 mmol/L (ref 3.5–5.1)
SODIUM: 139 mmol/L (ref 135–145)

## 2015-12-08 LAB — CBC
HEMATOCRIT: 44.8 % (ref 39.0–52.0)
HEMOGLOBIN: 14.6 g/dL (ref 13.0–17.0)
MCH: 27.5 pg (ref 26.0–34.0)
MCHC: 32.6 g/dL (ref 30.0–36.0)
MCV: 84.4 fL (ref 78.0–100.0)
PLATELETS: 233 10*3/uL (ref 150–400)
RBC: 5.31 MIL/uL (ref 4.22–5.81)
RDW: 13.7 % (ref 11.5–15.5)
WBC: 6.8 10*3/uL (ref 4.0–10.5)

## 2015-12-08 LAB — I-STAT TROPONIN, ED: Troponin i, poc: 0 ng/mL (ref 0.00–0.08)

## 2015-12-08 MED ORDER — GI COCKTAIL ~~LOC~~
30.0000 mL | Freq: Once | ORAL | Status: AC
  Administered 2015-12-08: 30 mL via ORAL
  Filled 2015-12-08: qty 30

## 2015-12-08 MED ORDER — KETOROLAC TROMETHAMINE 30 MG/ML IJ SOLN
30.0000 mg | Freq: Once | INTRAMUSCULAR | Status: AC
  Administered 2015-12-08: 30 mg via INTRAVENOUS
  Filled 2015-12-08: qty 1

## 2015-12-08 MED ORDER — PANTOPRAZOLE SODIUM 40 MG IV SOLR
40.0000 mg | Freq: Once | INTRAVENOUS | Status: AC
  Administered 2015-12-08: 40 mg via INTRAVENOUS
  Filled 2015-12-08: qty 40
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
## 2015-12-08 NOTE — ED Notes (Signed)
Pt is c/o pain just below his left breast  Pt states the pain started 2 days ago  Pt states the pain is constant and varies in intensity  Pt states he had some shortness of breath tonight  Pt states he has tightness and pressure in his chest at this time

## 2015-12-09 MED ORDER — OMEPRAZOLE 40 MG PO CPDR: 40.0000 mg | DELAYED_RELEASE_CAPSULE | Freq: Every day | ORAL | Status: AC

## 2015-12-09 NOTE — Discharge Instructions (Signed)
You were seen in the emergency room tonight for evaluation of chest pain. All of your labs and chest x-ray tonight were normal. As we discussed, your symptoms are most likely due to acid reflux or musculoskeletal pain. Chest pain can also come from anxiety which I suspect might be a factor in your chest pain as well. Please call your primary care provider within one week for follow-up. In the meantime I will give you a prescription for omeprazole (Prilosec) to take for acid reflux. You may take this in addition to the Prevacid that you have at home. Return to the ER for new or worsening symptoms.

## 2016-10-14 ENCOUNTER — Encounter: Payer: Self-pay | Admitting: Gastroenterology

## 2016-12-17 ENCOUNTER — Ambulatory Visit (INDEPENDENT_AMBULATORY_CARE_PROVIDER_SITE_OTHER): Payer: Medicaid Other | Admitting: Gastroenterology

## 2016-12-17 ENCOUNTER — Encounter (INDEPENDENT_AMBULATORY_CARE_PROVIDER_SITE_OTHER): Payer: Self-pay

## 2016-12-17 ENCOUNTER — Encounter: Payer: Self-pay | Admitting: Gastroenterology

## 2016-12-17 VITALS — BP 110/88 | HR 96 | Ht 68.0 in | Wt 250.0 lb

## 2016-12-17 DIAGNOSIS — K219 Gastro-esophageal reflux disease without esophagitis: Secondary | ICD-10-CM | POA: Diagnosis not present

## 2016-12-17 DIAGNOSIS — K529 Noninfective gastroenteritis and colitis, unspecified: Secondary | ICD-10-CM | POA: Diagnosis not present

## 2016-12-17 DIAGNOSIS — Z1211 Encounter for screening for malignant neoplasm of colon: Secondary | ICD-10-CM | POA: Diagnosis not present

## 2016-12-17 MED ORDER — NA SULFATE-K SULFATE-MG SULF 17.5-3.13-1.6 GM/177ML PO SOLN: 1.0000 | Freq: Once | ORAL | 0 refills | Status: AC
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
## 2016-12-17 NOTE — Patient Instructions (Signed)
If you are age 47 or older, your body mass index should be between 23-30. Your Body mass index is 38.01 kg/m. If this is out of the aforementioned range listed, please consider follow up with your Primary Care Provider.  If you are age 31 or younger, your body mass index should be between 19-25. Your Body mass index is 38.01 kg/m. If this is out of the aformentioned range listed, please consider follow up with your Primary Care Provider.   We have sent the following medications to your pharmacy for you to pick up at your convenience:  Suprep  Please start taking Omeprazole once daily per Dr Havery Moros.  You have been given a Low FodMap diet.  You have been scheduled for a colonoscopy. Please follow written instructions given to you at your visit today.  Please pick up your prep supplies at the pharmacy within the next 1-3 days. If you use inhalers (even only as needed), please bring them with you on the day of your procedure. Your physician has requested that you go to www.startemmi.com and enter the access code given to you at your visit today. This web site gives a general overview about your procedure. However, you should still follow specific instructions given to you by our office regarding your preparation for the procedure.  Thank you.

## 2016-12-20 ENCOUNTER — Encounter: Payer: Self-pay | Admitting: Gastroenterology

## 2016-12-20 ENCOUNTER — Ambulatory Visit (AMBULATORY_SURGERY_CENTER): Payer: Medicaid Other | Admitting: Gastroenterology

## 2016-12-20 VITALS — BP 117/83 | HR 83 | Temp 98.2°F | Resp 18 | Ht 68.0 in | Wt 250.0 lb

## 2016-12-20 DIAGNOSIS — D122 Benign neoplasm of ascending colon: Secondary | ICD-10-CM

## 2016-12-20 DIAGNOSIS — Z800 Family history of malignant neoplasm of digestive organs: Secondary | ICD-10-CM | POA: Diagnosis not present

## 2016-12-20 DIAGNOSIS — K529 Noninfective gastroenteritis and colitis, unspecified: Secondary | ICD-10-CM | POA: Diagnosis not present

## 2016-12-20 DIAGNOSIS — D123 Benign neoplasm of transverse colon: Secondary | ICD-10-CM

## 2016-12-20 DIAGNOSIS — Z1211 Encounter for screening for malignant neoplasm of colon: Secondary | ICD-10-CM

## 2016-12-20 MED ORDER — SODIUM CHLORIDE 0.9 % IV SOLN: 500.0000 mL | INTRAVENOUS | Status: AC

## 2016-12-20 NOTE — Progress Notes (Signed)
Called to room to assist during endoscopic procedure.  Patient ID and intended procedure confirmed with present staff. Received instructions for my participation in the procedure from the performing physician.  

## 2016-12-20 NOTE — Progress Notes (Signed)
Report to PACU, RN, vss, BBS= Clear.  

## 2016-12-20 NOTE — Patient Instructions (Addendum)
YOU HAD AN ENDOSCOPIC PROCEDURE TODAY AT [facility]:   Refer to the procedure report that was given to you for any specific questions about what was found during the examination.  If the procedure report does not answer your questions, please call your gastroenterologist to clarify.  If you requested that your care partner not be given the details of your procedure findings, then the procedure report has been included in a sealed envelope for you to review at your convenience later.  YOU SHOULD EXPECT: Some feelings of bloating in the abdomen. Passage of more gas than usual.  Walking can help get rid of the air that was put into your GI tract during the procedure and reduce the bloating. If you had a lower endoscopy (such as a colonoscopy or flexible sigmoidoscopy) you may notice spotting of blood in your stool or on the toilet paper. If you underwent a bowel prep for your procedure, you may not have a normal bowel movement for a few days.  Please Note:  You might notice some irritation and congestion in your nose or some drainage.  This is from the oxygen used during your procedure.  There is no need for concern and it should clear up in a day or so.  SYMPTOMS TO REPORT IMMEDIATELY:   Following lower endoscopy (colonoscopy or flexible sigmoidoscopy):  Excessive amounts of blood in the stool  Significant tenderness or worsening of abdominal pains  Swelling of the abdomen that is new, acute  Fever of 100F or higher  For urgent or emergent issues, a gastroenterologist can be reached at any hour by calling [phone].   DIET:  We do recommend a small meal at first, but then you may proceed to your regular diet.  Drink plenty of fluids but you should avoid alcoholic beverages for 24 hours.  ACTIVITY:  You should plan to take it easy for the rest of today and you should NOT DRIVE or use heavy machinery until tomorrow (because of the sedation medicines used during the test).     FOLLOW UP: Our staff will call the number listed on your records the next business day following your procedure to check on you and address any questions or concerns that you may have regarding the information given to you following your procedure. If we do not reach you, we will leave a message.  However, if you are feeling well and you are not experiencing any problems, there is no need to return our call.  We will assume that you have returned to your regular daily activities without incident.  If any biopsies were taken you will be contacted by phone or by letter within the next 1-3 weeks.  Please call us at [phone] if you have not heard about the biopsies in 3 weeks.    Await biopsy results Polyps (handout given) No aspirin, ibuprofen, naproxen, or other non-steroidal anti-inflammatory drugs for two weeks after polyps removal.  SIGNATURES/CONFIDENTIALITY: You and/or your care partner have signed paperwork which will be entered into your electronic medical record.  These signatures attest to the fact that that the information above on your After Visit Summary has been reviewed and is understood.  Full responsibility of the confidentiality of this discharge information lies with you and/or your care-partner.

## 2016-12-20 NOTE — Op Note (Addendum)
[place] Patient Name: Isaac Thomas Procedure Date: 12/20/2016 9:17 AM MRN: [id number] Endoscopist: Remo Lipps P. Armbruster MD, MD Age: 46 Referring MD:  Date of Birth: 1971-07-10 Gender: Male Account #: [id number] Procedure:                Colonoscopy Indications:              46 y/o AA male here for colon cancer screening in                            patient at increased risk: Family history of                            colorectal cancer in multiple 2nd degree relatives,                            first time exam, also with some chronic loose stools Medicines:                Monitored Anesthesia Care Procedure:                Pre-Anesthesia Assessment:                           - Prior to the procedure, a History and Physical                            was performed, and patient medications and                            allergies were reviewed. The patient's tolerance of                            previous anesthesia was also reviewed. The risks                            and benefits of the procedure and the sedation                            options and risks were discussed with the patient.                            All questions were answered, and informed consent                            was obtained. Prior Anticoagulants: The patient has                            taken no previous anticoagulant or antiplatelet                            agents. ASA Grade Assessment: III - A patient with                            severe systemic disease. After reviewing the risks  and benefits, the patient was deemed in                            satisfactory condition to undergo the procedure.                           After obtaining informed consent, the colonoscope                            was passed under direct vision. Throughout the                            procedure, the patient's blood pressure, pulse, and                             oxygen saturations were monitored continuously. The                            Model PCF-H190L [id number]) scope was introduced                            through the anus and advanced to the the terminal                            ileum, with identification of the appendiceal                            orifice and IC valve. The colonoscopy was performed                            without difficulty. The patient tolerated the                            procedure well. The quality of the bowel                            preparation was adequate. The terminal ileum, the                            appendiceal orifice and the rectum were                            photographed. Scope In: 9:19:50 AM Scope Out: 9:37:32 AM Scope Withdrawal Time: 0 hours 14 minutes 30 seconds  Total Procedure Duration: 0 hours 17 minutes 42 seconds  Findings:                 The perianal and digital rectal examinations were                            normal.                           A 4 mm polyp was found in the ascending colon. The  polyp was sessile. The polyp was removed with a                            cold snare. Resection and retrieval were complete.                           Two sessile polyps were found in the transverse                            colon. The polyps were 4 to 5 mm in size. These                            polyps were removed with a cold snare. Resection                            and retrieval were complete.                           A few small-mouthed diverticula were found in the                            transverse colon.                           The terminal ileum appeared normal.                           Internal hemorrhoids were found during retroflexion.                           The exam was otherwise without abnormality. No                            inflammatory changes noted. Of note, I thought a                            photo of the IC valve  was taken but it was                            unfortunately not captured.                           Biopsies for histology were taken with a cold                            forceps from the right colon and left colon for                            evaluation of microscopic colitis. Complications:            No immediate complications. Estimated blood loss:                            Minimal. Estimated Blood Loss:     Estimated blood loss  was minimal. Impression:               - One 4 mm polyp in the ascending colon, removed                            with a cold snare. Resected and retrieved.                           - Two 4 to 5 mm polyps in the transverse colon,                            removed with a cold snare. Resected and retrieved.                           - Diverticulosis in the transverse colon.                           - The examined portion of the ileum was normal.                           - Internal hemorrhoids.                           - The examination was otherwise normal.                           - Biopsies were taken with a cold forceps from the                            right colon and left colon for evaluation of                            microscopic colitis. Recommendation:           - Patient has a contact number available for                            emergencies. The signs and symptoms of potential                            delayed complications were discussed with the                            patient. Return to normal activities tomorrow.                            Written discharge instructions were provided to the                            patient.                           - Resume previous diet.                           - Continue  present medications.                           - No aspirin, ibuprofen, naproxen, or other                            non-steroidal anti-inflammatory drugs for 2 weeks                            after polyp  removal.                           - Await pathology results.                           - Repeat colonoscopy is recommended for                            surveillance. The colonoscopy date will be                            determined after pathology results from today's                            exam become available for review. Remo Lipps P. Armbruster MD, MD 12/20/2016 9:42:49 AM This report has been signed electronically.

## 2016-12-21 ENCOUNTER — Telehealth: Payer: Self-pay

## 2016-12-21 NOTE — Telephone Encounter (Signed)
  Follow up Call-  Call back number 12/20/2016  Post procedure Call Back phone  # [phone]  Permission to leave phone message Yes  Some recent data might be hidden     Patient questions:  Do you have a fever, pain , or abdominal swelling? No. Pain Score  0 *  Have you tolerated food without any problems? Yes.    Have you been able to return to your normal activities? Yes.    Do you have any questions about your discharge instructions: Diet   No. Medications  No. Follow up visit  No.  Do you have questions or concerns about your Care? No.  Actions: * If pain score is 4 or above: No action needed, pain <4.

## 2016-12-24 ENCOUNTER — Encounter: Payer: Self-pay | Admitting: Gastroenterology

## 2021-05-20 ENCOUNTER — Emergency Department (HOSPITAL_COMMUNITY): Payer: Medicaid Other

## 2021-05-20 ENCOUNTER — Encounter (HOSPITAL_COMMUNITY): Payer: Self-pay

## 2021-05-20 ENCOUNTER — Emergency Department (HOSPITAL_COMMUNITY)
Admission: EM | Admit: 2021-05-20 | Discharge: 2021-05-20 | Disposition: A | Discharge status: Home / Self Care | Payer: Medicaid Other | Attending: Emergency Medicine | Admitting: Emergency Medicine

## 2021-05-20 DIAGNOSIS — Z87891 Personal history of nicotine dependence: Secondary | ICD-10-CM | POA: Insufficient documentation

## 2021-05-20 DIAGNOSIS — M79671 Pain in right foot: Secondary | ICD-10-CM | POA: Diagnosis present

## 2021-05-20 MED ORDER — HYDROMORPHONE HCL 1 MG/ML IJ SOLN
0.5000 mg | Freq: Once | INTRAMUSCULAR | Status: AC
  Administered 2021-05-20: 0.5 mg via INTRAMUSCULAR
  Filled 2021-05-20: qty 1

## 2021-05-20 MED ORDER — DOXYCYCLINE HYCLATE 100 MG PO CAPS: 100.0000 mg | ORAL_CAPSULE | Freq: Two times a day (BID) | ORAL | 0 refills | Status: AC

## 2021-05-20 MED ORDER — DOXYCYCLINE HYCLATE 100 MG PO TABS
100.0000 mg | ORAL_TABLET | Freq: Once | ORAL | Status: AC
  Administered 2021-05-20: 100 mg via ORAL
  Filled 2021-05-20: qty 1

## 2021-05-20 NOTE — ED Triage Notes (Signed)
Pt c/o pain to top of R foot since yesterday, denies injury. States he has a history of cellulitis and MRSA infections. Denies fevers

## 2021-05-20 NOTE — ED Provider Notes (Signed)
[facility] DEPT Provider Note   CSN: [id number] Arrival date & time: 05/20/21  0545     History Chief Complaint  Patient presents with  . Foot Pain    Isaac Thomas is a 50 y.o. male.  50 year old male with prior medical history as detailed below presents for evaluation.  Patient reports painful area to the top of his right foot.  This started yesterday.  He is concerned about the possibility of early infection.  He denies fever.  He denies specific injury.  He denies other complaint.  The history is provided by the patient and medical records.  Foot Pain This is a new problem. The current episode started yesterday. The problem occurs rarely. The problem has not changed since onset.Pertinent negatives include no chest pain and no abdominal pain. Nothing aggravates the symptoms. Nothing relieves the symptoms.       Past Medical History:  Diagnosis Date  . 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 Arthritis    back  . Depression   . GERD (gastroesophageal reflux disease)     Patient Active Problem List   Diagnosis Date Noted  . Major depression 04/17/2014  . Anxiety state, unspecified 04/17/2014  . Cocaine abuse ([place]) 04/17/2014  . Cellulitis 08/13/2013  . S/P AKA (above knee amputation) ([place]) 08/13/2013  . GERD (gastroesophageal reflux disease) 07/31/2013  . Depression (emotion) 07/31/2013  . Neuropathy 07/31/2013  . Erectile dysfunction 07/31/2013    Past Surgical History:  Procedure Laterality Date  . ABDOMINAL SURGERY    . ABOVE KNEE LEG AMPUTATION Left   . CHOLECYSTECTOMY    . FOOT FUSION Right   . SKIN GRAFT         Family History  Problem Relation Age of Onset  . Hypertension Mother   . Lung cancer Father   . Diabetes Maternal Grandmother   . Hypertension Maternal Grandmother   . Alzheimer's disease Paternal Grandmother   . Hypertension Paternal Grandmother   . Prostate cancer Paternal Uncle   . Colon cancer  Cousin        moms side of the family  . Colon cancer Maternal Uncle        great uncle  . Liver cancer Other        neice  . Stomach cancer Neg Hx   . Rectal cancer Neg Hx   . Esophageal cancer Neg Hx     Social History   Tobacco Use  . Smoking status: Former Smoker    Types: Cigarettes  . Smokeless tobacco: Current User  . Tobacco comment: vaping  Substance Use Topics  . Alcohol use: Yes    Comment: rarely  . Drug use: No    Comment: Former, not in the past 10 years    Home Medications Prior to Admission medications   Medication Sig Start Date End Date Taking? Authorizing Provider  doxycycline (VIBRAMYCIN) 100 MG capsule Take 1 capsule (100 mg total) by mouth 2 (two) times daily. 05/20/21  Yes Isaac Merino, MD  ALPRAZolam Isaac Thomas) 1 MG tablet Take 1 mg by mouth at bedtime as needed for anxiety.    [provider]  cetirizine (ZYRTEC) 10 MG tablet daily at 2 PM. 11/28/16   [provider]  CIALIS 5 MG tablet as needed. 11/12/16   [provider]  fluticasone (FLONASE) 50 MCG/ACT nasal spray Place 2 sprays into both nostrils daily. For allergies 04/19/14   Isaac Spar I, NP  ibuprofen (  ADVIL,MOTRIN) 800 MG tablet Take 1 tablet (800 mg total) by mouth every 8 (eight) hours as needed for fever or mild pain (Mild pain). Please take with food 04/19/14   Isaac Spar I, NP  lansoprazole (PREVACID) 30 MG capsule Take 30 mg by mouth daily at 12 noon.    [provider]  omeprazole (PRILOSEC) 40 MG capsule Take 1 capsule (40 mg total) by mouth daily as needed (heartburn). /Acid reflux 04/19/14   Isaac Spar I, NP  omeprazole (PRILOSEC) 40 MG capsule Take 1 capsule (40 mg total) by mouth daily. 12/09/15   Isaac, Olivia Canter, PA-C  Oxycodone HCl 20 MG TABS 2 (two) times daily. 12/03/16   [provider]  traMADol (ULTRAM) 50 MG tablet Take 1 tablet (50 mg total) by mouth every 6 (six) hours as needed. 07/17/15   Thomas, Kaitlyn, PA-C  traZODone  (DESYREL) 150 MG tablet Take 150 mg by mouth at bedtime.    [provider]    Allergies    Morphine and related and Pork-derived products  Review of Systems   Review of Systems  Cardiovascular: Negative for chest pain.  Gastrointestinal: Negative for abdominal pain.  All other systems reviewed and are negative.   Physical Exam Updated Vital Signs BP 118/80   Pulse 72   Temp 97.7 F (36.5 C) (Oral)   Resp 18   Ht 5\' 7"  (1.702 m)   Wt 108.9 kg   SpO2 95%   BMI 37.59 kg/m   Physical Exam Vitals and nursing note reviewed.  Constitutional:      General: He is not in acute distress.    Appearance: He is well-developed.  HENT:     Head: Normocephalic and atraumatic.  Eyes:     Conjunctiva/sclera: Conjunctivae normal.     Pupils: Pupils are equal, round, and reactive to light.  Cardiovascular:     Rate and Rhythm: Normal rate and regular rhythm.     Heart sounds: Normal heart sounds.  Pulmonary:     Effort: Pulmonary effort is normal. No respiratory distress.     Breath sounds: Normal breath sounds.  Abdominal:     General: There is no distension.     Palpations: Abdomen is soft.     Tenderness: There is no abdominal tenderness.  Musculoskeletal:        General: No deformity. Normal range of motion.     Cervical back: Normal range of motion and neck supple.  Skin:    General: Skin is warm and dry.     Comments: Small area of likely contusion to the dorsum of the right foot.  See image below.  Neurological:     Mental Status: He is alert and oriented to person, place, and time.       ED Results / Procedures / Treatments   Labs (all labs ordered are listed, but only abnormal results are displayed) Labs Reviewed - No data to display  EKG None  Radiology DG Foot Complete Right  Result Date: 05/20/2021 CLINICAL DATA:  Foot pain. Swelling. pain across dorsum of RIGHT metatarsals centrally x 2 days, denies inj. Pt reports previous episode similar  w/cellulitis developing. Pt hx LEFT bka EXAM: RIGHT FOOT COMPLETE - 3+ VIEW COMPARISON:  None. FINDINGS: Diffusely decreased bone density. No cortical erosion or destruction. No acute displaced fracture or dislocation. Severe degenerative changes of the first digit interphalangeal and metatarsophalangeal joint. Degenerative changes of the midfoot and hindfoot. Screw fixation of a distal tibiotalar fracture. Grossly  unremarkable soft tissues. IMPRESSION: 1. Negative for acute traumatic injury. 2. No radiographic findings of osteomyelitis. Electronically Signed   By: Iven Finn M.D.   On: 05/20/2021 06:29    Procedures Procedures   Medications Ordered in ED Medications  doxycycline (VIBRA-TABS) tablet 100 mg (100 mg Oral Given 05/20/21 0736)  HYDROmorphone (DILAUDID) injection 0.5 mg (0.5 mg Intramuscular Given 05/20/21 0736)    ED Course  Thomas have reviewed the triage vital signs and the nursing notes.  Pertinent labs & imaging results that were available during my care of the patient were reviewed by me and considered in my medical decision making (see chart for details).    MDM Rules/Calculators/A&P                          MDM  MSE complete  Trevaris Pennella was evaluated in Emergency Department on 05/20/2021 for the symptoms described in the history of present illness. He was evaluated in the context of the global COVID-19 pandemic, which necessitated consideration that the patient might be at risk for infection with the SARS-CoV-2 virus that causes COVID-19. Institutional protocols and algorithms that pertain to the evaluation of patients at risk for COVID-19 are in a state of rapid change based on information released by regulatory bodies including the CDC and federal and state organizations. These policies and algorithms were followed during the patient's care in the ED.  Patient is concerned about possibly early infection to the dorsum of the right foot.  History and exam are not  consistent with likely infection.  However, given patient's concerns will prescribe short course of doxycycline.  Patient does understand need for close follow-up.  Strict return precautions given and understood.  Final Clinical Impression(s) / ED Diagnoses Final diagnoses:  Foot pain, right    Rx / DC Orders ED Discharge Orders         Ordered    doxycycline (VIBRAMYCIN) 100 MG capsule  2 times daily        05/20/21 0725           Isaac Merino, MD 05/20/21 [phone]

## 2021-05-20 NOTE — Discharge Instructions (Addendum)
Return for any problem.  ?

## 2022-09-28 ENCOUNTER — Emergency Department (HOSPITAL_COMMUNITY)
Admission: EM | Admit: 2022-09-28 | Discharge: 2022-09-28 | Discharge status: Against Medical Advice | Payer: Medicaid Other | Attending: Student | Admitting: Student

## 2022-09-28 ENCOUNTER — Encounter (HOSPITAL_COMMUNITY): Payer: Self-pay | Admitting: *Deleted

## 2022-09-28 ENCOUNTER — Other Ambulatory Visit: Payer: Self-pay

## 2022-09-28 DIAGNOSIS — Z5321 Procedure and treatment not carried out due to patient leaving prior to being seen by health care provider: Secondary | ICD-10-CM | POA: Insufficient documentation

## 2022-09-28 DIAGNOSIS — R1032 Left lower quadrant pain: Secondary | ICD-10-CM | POA: Diagnosis present

## 2022-09-28 DIAGNOSIS — R110 Nausea: Secondary | ICD-10-CM | POA: Insufficient documentation

## 2022-09-28 LAB — CBC WITH DIFFERENTIAL/PLATELET
Abs Immature Granulocytes: 0.02 10*3/uL (ref 0.00–0.07)
Basophils Absolute: 0 10*3/uL (ref 0.0–0.1)
Basophils Relative: 0 %
Eosinophils Absolute: 0.1 10*3/uL (ref 0.0–0.5)
Eosinophils Relative: 2 %
HCT: 49.2 % (ref 39.0–52.0)
Hemoglobin: 15.9 g/dL (ref 13.0–17.0)
Immature Granulocytes: 0 %
Lymphocytes Relative: 36 %
Lymphs Abs: 2.3 10*3/uL (ref 0.7–4.0)
MCH: 27.2 pg (ref 26.0–34.0)
MCHC: 32.3 g/dL (ref 30.0–36.0)
MCV: 84.2 fL (ref 80.0–100.0)
Monocytes Absolute: 0.4 10*3/uL (ref 0.1–1.0)
Monocytes Relative: 6 %
Neutro Abs: 3.5 10*3/uL (ref 1.7–7.7)
Neutrophils Relative %: 56 %
Platelets: 264 10*3/uL (ref 150–400)
RBC: 5.84 MIL/uL — ABNORMAL HIGH (ref 4.22–5.81)
RDW: 13.2 % (ref 11.5–15.5)
WBC: 6.2 10*3/uL (ref 4.0–10.5)
nRBC: 0 % (ref 0.0–0.2)

## 2022-09-28 LAB — COMPREHENSIVE METABOLIC PANEL
ALT: 22 U/L (ref 0–44)
AST: 26 U/L (ref 15–41)
Albumin: 4.4 g/dL (ref 3.5–5.0)
Alkaline Phosphatase: 64 U/L (ref 38–126)
Anion gap: 6 (ref 5–15)
BUN: 5 mg/dL — ABNORMAL LOW (ref 6–20)
CO2: 27 mmol/L (ref 22–32)
Calcium: 9.9 mg/dL (ref 8.9–10.3)
Chloride: 102 mmol/L (ref 98–111)
Creatinine, Ser: 0.69 mg/dL (ref 0.61–1.24)
GFR, Estimated: >60 mL/min (ref >60)
Glucose, Bld: 97 mg/dL (ref 70–99)
Potassium: 4.4 mmol/L (ref 3.5–5.1)
Sodium: 135 mmol/L (ref 135–145)
Total Bilirubin: 0.6 mg/dL (ref 0.3–1.2)
Total Protein: 8.8 g/dL — ABNORMAL HIGH (ref 6.5–8.1)

## 2022-09-28 LAB — LIPASE, BLOOD: Lipase: 34 U/L (ref 11–51)

## 2022-09-28 NOTE — ED Provider Triage Note (Signed)
Emergency Medicine Provider Triage Evaluation Note  Brockton Mckesson , a 51 y.o. male  was evaluated in triage.  Pt complains of flank pain. Patient states for about 5 days he has had left sided flank and abdominal pain. States it is under his left rib cage and back. States it is intermittent. Has had associated nausea without vomiting. He denies dysuria, hematuria, frequency or urgency. Denies radiating pain. Denies fevers. States he started Ozempic about 3 months ago but has not had issues with this. .  Review of Systems  Positive: See above Negative:   Physical Exam  BP (!) 130/90 (BP Location: Left Arm)   Pulse 90   Temp 97.6 F (36.4 C) (Oral)   Resp 16   Ht '5\' 8"'$  (1.727 m)   Wt 102.1 kg   SpO2 100%   BMI 34.21 kg/m  Gen:   Awake, no distress   Resp:  Normal effort  MSK:   Moves extremities without difficulty  Other:  Abdomen is rounded. Large midline scar to the abdomen well healed. CVAT as well as LUQ abdominal tenderness on palpation. No zoster rash.  Medical Decision Making  Medically screening exam initiated at 12:45 PM.  Appropriate orders placed.  Aadvik Roker was informed that the remainder of the evaluation will be completed by another provider, this initial triage assessment does not replace that evaluation, and the importance of remaining in the ED until their evaluation is complete.  Will obtain labs and can order imaging after if needed.    Mickie Hillier, PA-C 09/28/22 1247

## 2022-09-28 NOTE — ED Triage Notes (Signed)
Left sided upper flank pain x 5 days. Nausea at times.

## 2023-05-02 IMAGING — CR DG FOOT COMPLETE 3+V*R*
3 series · 3 of 3 positions shown · non-contrast
Comparison: None.

CLINICAL DATA: Foot pain. Swelling. pain across dorsum of RIGHT
metatarsals centrally x 2 days, denies inj. Pt reports previous
episode similar w/cellulitis developing. Pt hx LEFT bka

EXAM:
RIGHT FOOT COMPLETE - 3+ VIEW

[x foot ap right]
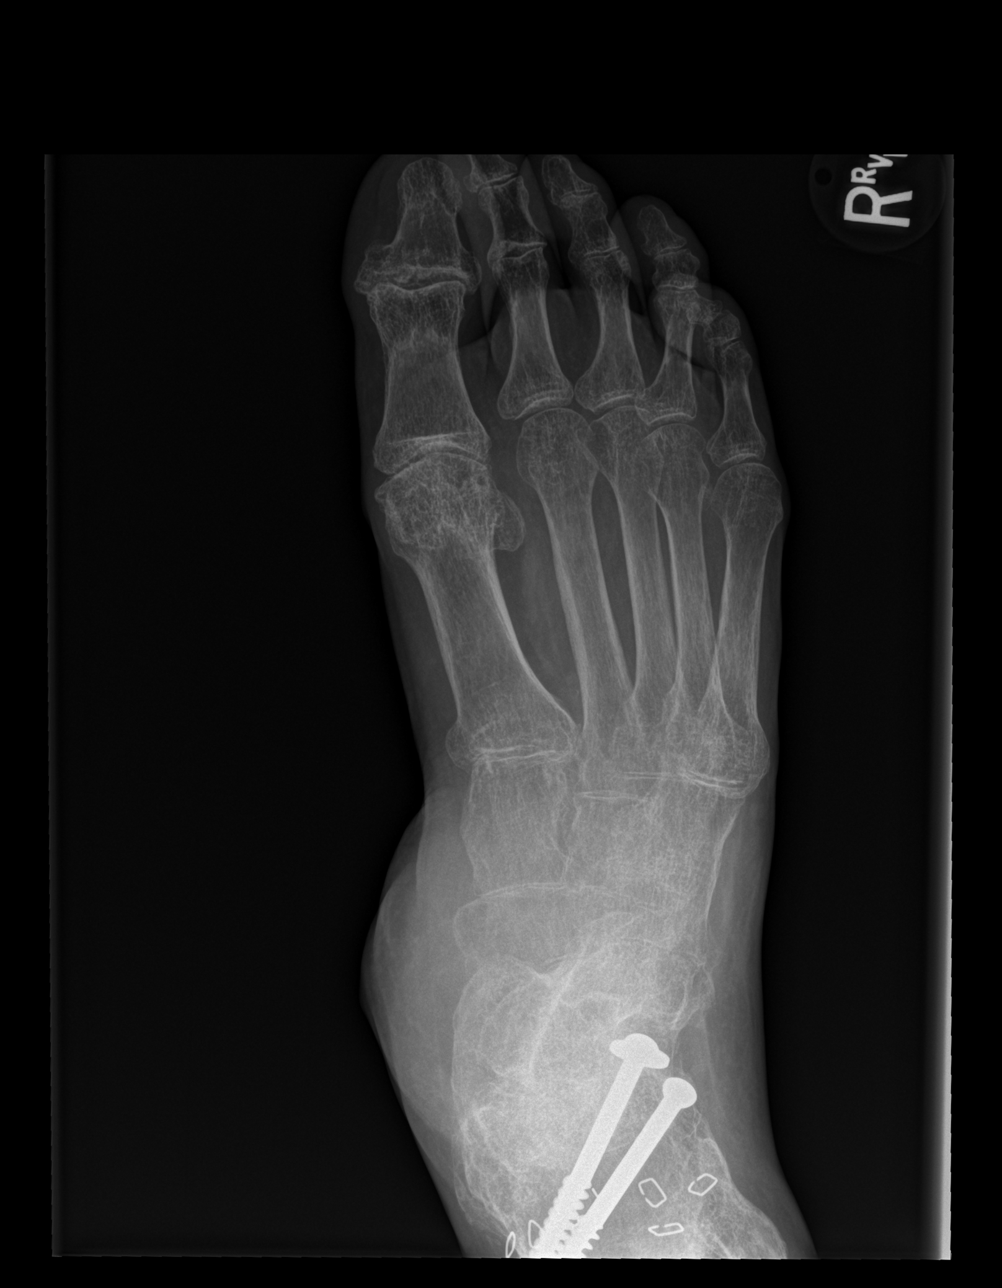

[x foot obl right]
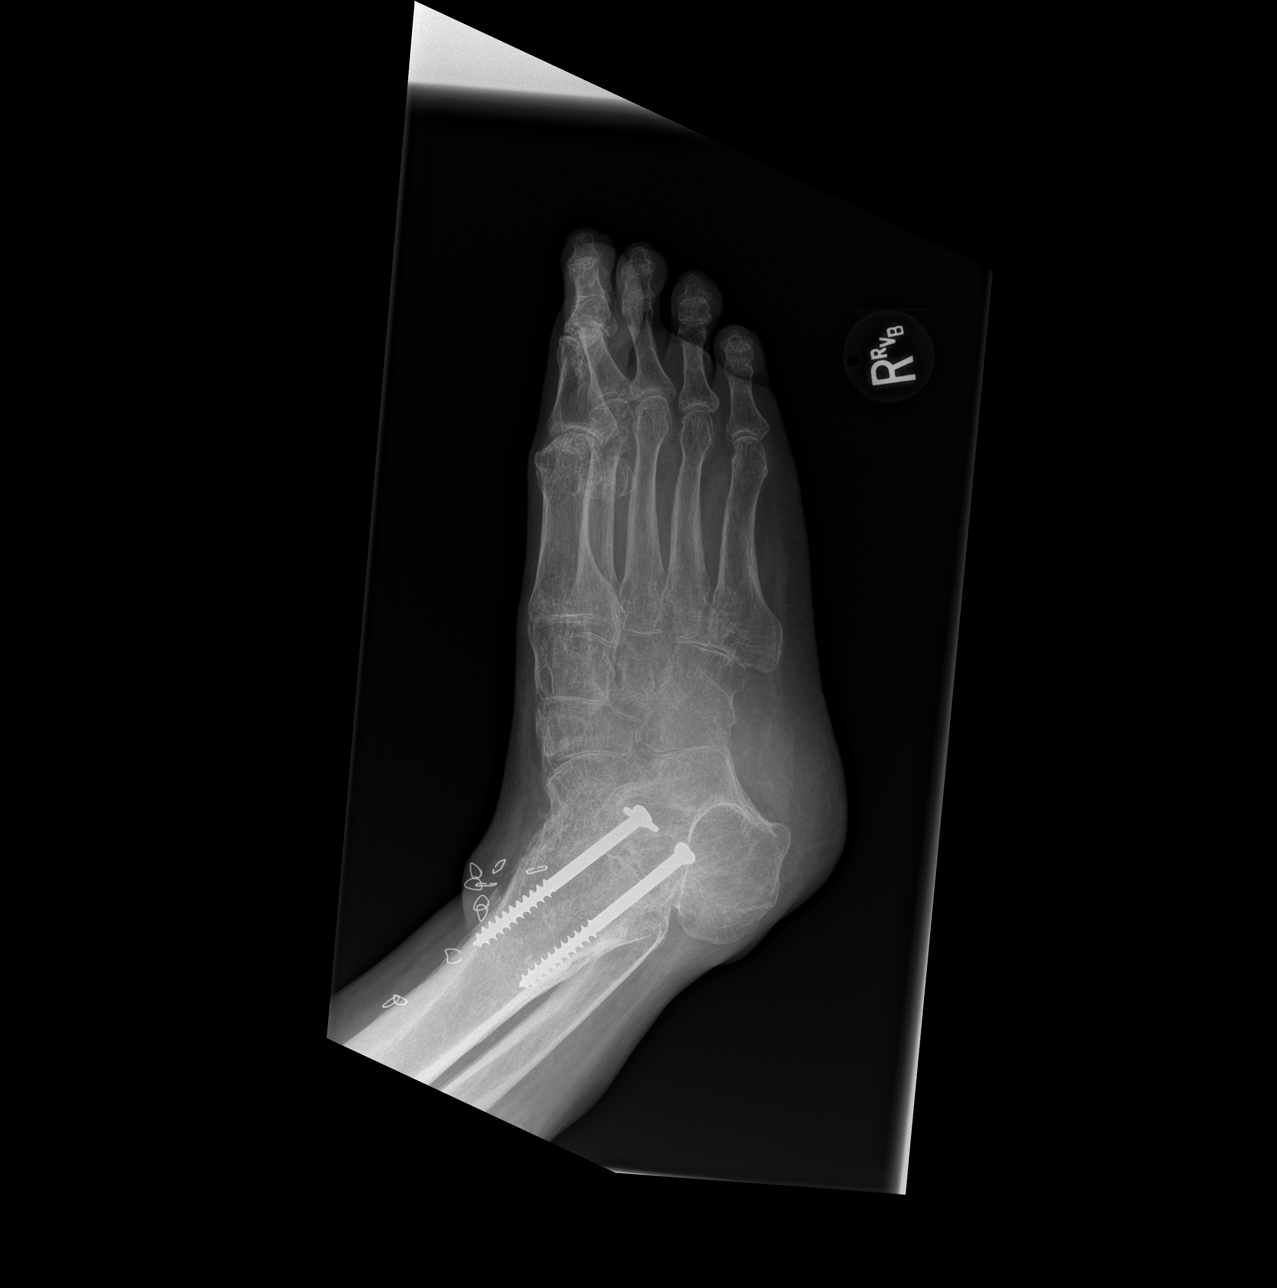

[x foot lat right]
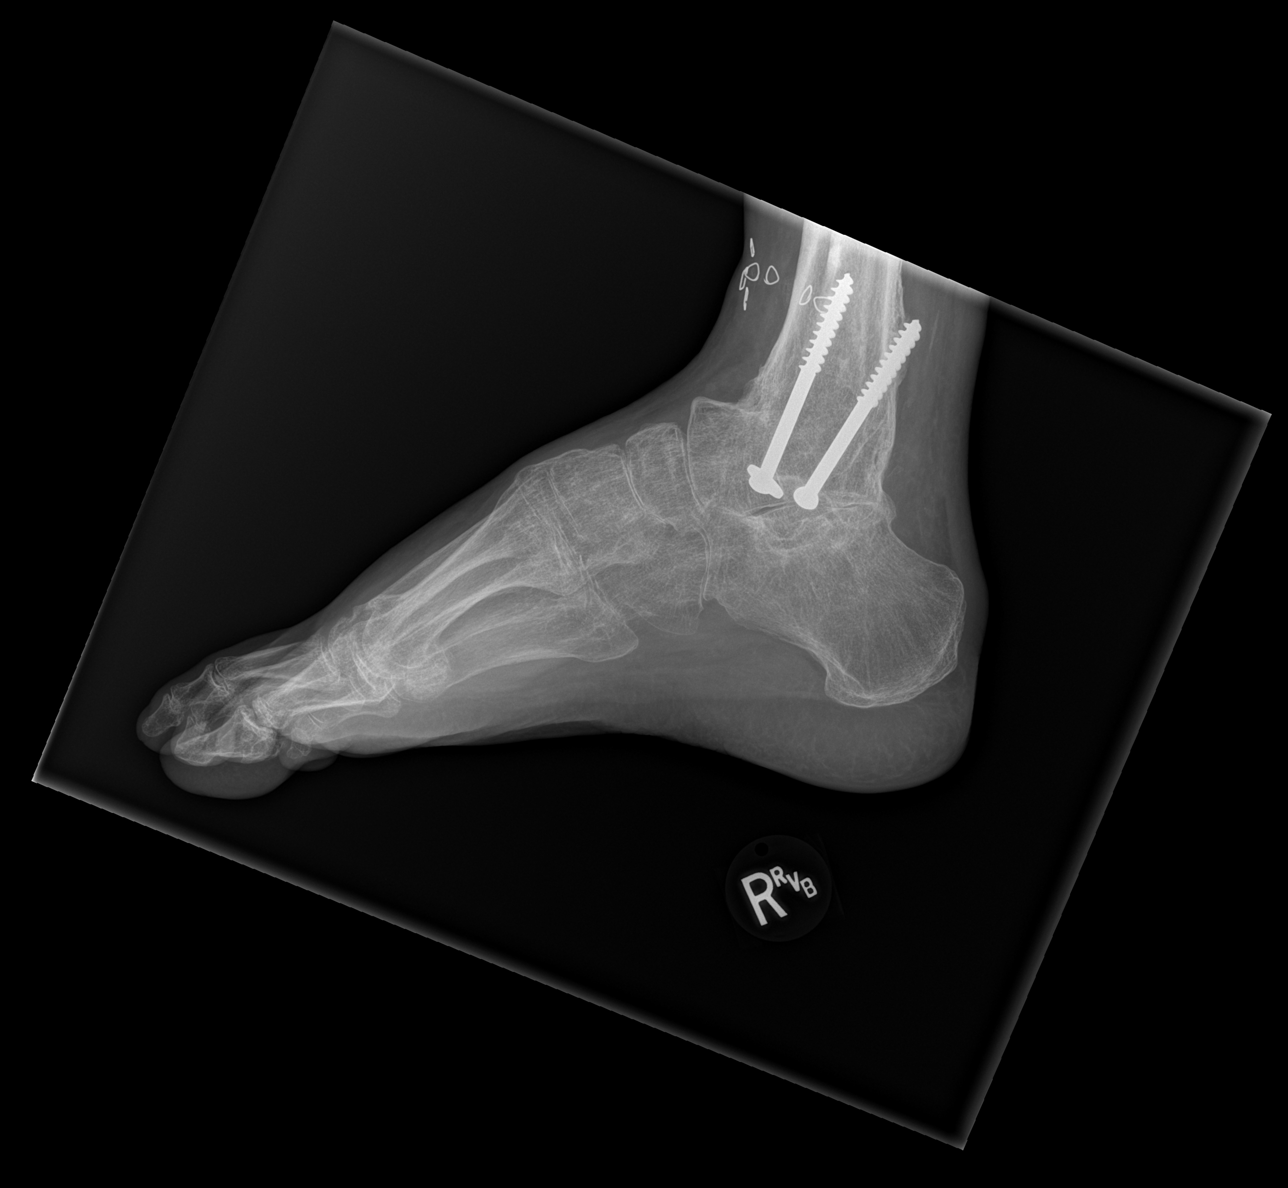

[3 of 3 positions shown; findings below may reference images not displayed]

FINDINGS: Diffusely decreased bone density. No cortical erosion or
destruction. No acute displaced fracture or dislocation. Severe
degenerative changes of the first digit interphalangeal and
metatarsophalangeal joint. Degenerative changes of the midfoot and
hindfoot.

Screw fixation of a distal tibiotalar fracture.

Grossly unremarkable soft tissues.
IMPRESSION: 1. Negative for acute traumatic injury.
2. No radiographic findings of osteomyelitis.
